# Patient Record
Sex: Male | Born: 1950 | Race: Black or African American | Hispanic: No | Marital: Married | Smoking: Current every day smoker
Health system: Southern US, Community
[De-identification: ages and names within clinical notes are randomized; demographics above are authoritative.]

## PROBLEM LIST (undated history)

## (undated) DIAGNOSIS — I10 Essential (primary) hypertension: Secondary | ICD-10-CM

## (undated) DIAGNOSIS — C61 Malignant neoplasm of prostate: Secondary | ICD-10-CM

## (undated) HISTORY — PX: PROSTATE BIOPSY: SHX241

## (undated) HISTORY — PX: MOUTH SURGERY: SHX715

## (undated) HISTORY — PX: TONSILLECTOMY: SUR1361

---

## 1998-10-11 ENCOUNTER — Ambulatory Visit (HOSPITAL_COMMUNITY): Admission: RE | Admit: 1998-10-11 | Discharge: 1998-10-11 | Payer: Self-pay | Admitting: Gastroenterology

## 2003-09-06 ENCOUNTER — Ambulatory Visit (HOSPITAL_COMMUNITY): Admission: RE | Admit: 2003-09-06 | Discharge: 2003-09-06 | Payer: Self-pay | Admitting: Gastroenterology

## 2009-09-29 ENCOUNTER — Emergency Department (HOSPITAL_COMMUNITY): Admission: EM | Admit: 2009-09-29 | Discharge: 2009-09-29 | Payer: Self-pay | Admitting: Emergency Medicine

## 2010-06-15 LAB — CULTURE, ROUTINE-ABSCESS

## 2010-08-15 NOTE — Op Note (Signed)
NAME:  Dylan Griffith, Dylan Griffith                          ACCOUNT NO.:  [id number]   MEDICAL RECORD NO.:  [id number]                   PATIENT TYPE:  AMB   LOCATION:  ENDO                                 FACILITY:  [facility]   PHYSICIAN:  John C. Madilyn Fireman, M.D.                 DATE OF BIRTH:  [birth year]   DATE OF PROCEDURE:  09/06/2003  DATE OF DISCHARGE:                                 OPERATIVE REPORT   PROCEDURE:  Colonoscopy.   INDICATIONS FOR PROCEDURE:  Heme-positive stools.   DESCRIPTION OF PROCEDURE:  The patient was placed in the left lateral  decubitus position and placed on the pulse monitor with continuous low-flow  oxygen delivered by nasal cannula.  He was sedated with 62.57 mcg IV  fentanyl and @@ mg IV Versed.  The Olympus video colonoscope was inserted  into the rectum and advanced to the cecum, confirmed by transillumination of  McBurney's point and visualization of the ileocecal valve and appendiceal  orifice.  Prep was good.  The cecum, ascending, transverse, descending, and  sigmoid colon all appeared normal with no masses, polyps, diverticula, or  other mucosal abnormalities.  The rectum likewise appeared normal and  retroflexed view of the anus revealed no obvious internal hemorrhoids.  The  scope was then withdrawn and the patient returned to the recovery room in  stable condition.  He tolerated the procedure well and there were no  immediate complications.   IMPRESSION:  Normal colonoscopy.   PLAN:  Will repeat hemoccults in four weeks.                                               John C. Madilyn Fireman, M.D.    JCH/MEDQ  D:  09/06/2003  T:  09/06/2003  Job:  [id number]   cc:   Jethro Bastos, M.D.  [address]  [place]  [place] [address]  Fax: [phone]

## 2013-09-26 ENCOUNTER — Ambulatory Visit
Admission: RE | Admit: 2013-09-26 | Discharge: 2013-09-26 | Disposition: A | Discharge status: Home / Self Care | Payer: Medicare PPO | Source: Ambulatory Visit | Attending: Family Medicine | Admitting: Family Medicine

## 2013-09-26 ENCOUNTER — Other Ambulatory Visit: Payer: Self-pay | Admitting: Family Medicine

## 2013-09-26 DIAGNOSIS — M25562 Pain in left knee: Secondary | ICD-10-CM

## 2013-09-26 DIAGNOSIS — M25462 Effusion, left knee: Secondary | ICD-10-CM

## 2014-01-02 ENCOUNTER — Other Ambulatory Visit: Payer: Self-pay | Admitting: Gastroenterology

## 2014-06-18 ENCOUNTER — Other Ambulatory Visit: Payer: Self-pay | Admitting: Family Medicine

## 2014-06-18 DIAGNOSIS — Z139 Encounter for screening, unspecified: Secondary | ICD-10-CM

## 2014-06-25 ENCOUNTER — Ambulatory Visit: Payer: Medicare PPO

## 2014-06-27 ENCOUNTER — Ambulatory Visit: Payer: Medicare PPO

## 2014-07-02 ENCOUNTER — Ambulatory Visit
Admission: RE | Admit: 2014-07-02 | Discharge: 2014-07-02 | Disposition: A | Discharge status: Home / Self Care | Payer: Medicare PPO | Source: Ambulatory Visit | Attending: Family Medicine | Admitting: Family Medicine

## 2014-07-02 DIAGNOSIS — Z139 Encounter for screening, unspecified: Secondary | ICD-10-CM

## 2014-12-18 DIAGNOSIS — F17200 Nicotine dependence, unspecified, uncomplicated: Secondary | ICD-10-CM | POA: Diagnosis not present

## 2014-12-18 DIAGNOSIS — C61 Malignant neoplasm of prostate: Secondary | ICD-10-CM | POA: Diagnosis not present

## 2014-12-18 DIAGNOSIS — I10 Essential (primary) hypertension: Secondary | ICD-10-CM | POA: Diagnosis not present

## 2014-12-18 DIAGNOSIS — Z23 Encounter for immunization: Secondary | ICD-10-CM | POA: Diagnosis not present

## 2014-12-26 DIAGNOSIS — C61 Malignant neoplasm of prostate: Secondary | ICD-10-CM | POA: Diagnosis not present

## 2015-03-19 ENCOUNTER — Telehealth: Payer: Self-pay | Admitting: Medical Oncology

## 2015-03-19 NOTE — Telephone Encounter (Signed)
Oncology Nurse Navigator Documentation  Oncology Nurse Navigator Flowsheets 03/19/2015  Referral date to RadOnc/MedOnc 03/15/2015  Navigator Encounter Type Introductory phone call  Interventions Coordination of Care  Coordination of Care MD Appointments  Support Groups/Services Friends and Family  Time Spent with Patient 15   I called pt to introduce myself as the Prostate Nurse Navigator and the Coordinator of the Prostate [place].  1. I confirmed with the patient he is aware of his referral to the clinic 12/27 arriving at 12:30. He states that he has to keep his grandson next week and will not be able to come on the 27 th. I will call him back with a new date and time.   2. I discussed the format of the clinic and the physicians he will be seeing that day.  3. I discussed where the clinic is located and how to contact me.  4. I confirmed his address and informed him I would be mailing a packet of information and forms to be completed. I asked him to bring them with him the day of his appointment.   He voiced understanding of the above. I asked him to call me if he has any questions or concerns regarding his appointments or the forms he needs to complete.

## 2015-03-19 NOTE — Telephone Encounter (Signed)
Oncology Nurse Navigator Documentation  Oncology Nurse Navigator Flowsheets 03/19/2015 03/19/2015  Referral date to RadOnc/MedOnc 03/15/2015 -  Navigator Encounter Type Introductory phone call Telephone- I called Dylan Griffith to let him know that I was able to reschedule his appointment for the Prostate [place] to 04/23/15 arriving at 12:15pm. He voiced understanding. I will include this in the packet with the medical forms. He voiced understanding.  Interventions Coordination of Care Coordination of Care  Coordination of Care MD Appointments MD Appointments  Support Groups/Services Friends and Family -  Time Spent with Patient 15 15

## 2015-03-26 ENCOUNTER — Ambulatory Visit: Payer: Medicare PPO | Admitting: Radiation Oncology

## 2015-04-17 ENCOUNTER — Encounter: Payer: Self-pay | Admitting: Adult Health

## 2015-04-17 DIAGNOSIS — C61 Malignant neoplasm of prostate: Secondary | ICD-10-CM | POA: Insufficient documentation

## 2015-04-22 ENCOUNTER — Encounter: Payer: Self-pay | Admitting: Radiation Oncology

## 2015-04-22 ENCOUNTER — Telehealth: Payer: Self-pay | Admitting: Medical Oncology

## 2015-04-22 NOTE — Progress Notes (Signed)
GU Location of Tumor / Histology: prostatic adenocarcinoma  If Prostate Cancer, Gleason Score is (3 + 3) and PSA is (13.24) on 12/26/2014  Dylan Griffith was diagnosed with prostate cancer in July 2011. He chose to be on active surveillance bu, had refused repeat biopsy. Then, January 2015 his PSA continued to increase so he had a second biopsy. He decided not to have definitive treatment but, his PSA continues to rise.  Biopsies of prostate (if applicable) revealed:    Past/Anticipated interventions by urology, if any: biopsy, diagnosis, surveillance, repeat biopsy, referral to [facility]  Past/Anticipated interventions by medical oncology, if any: no  Weight changes, if any: no  Bowel/Bladder complaints, if any: nocturia   Nausea/Vomiting, if any: no  Pain issues, if any:  no  SAFETY ISSUES:  Prior radiation? no  Pacemaker/ICD? no  Possible current pregnancy? no  Is the patient on methotrexate? no  Current Complaints / other details:  65 year old male. Married.

## 2015-04-22 NOTE — Telephone Encounter (Signed)
Oncology Nurse Navigator Documentation  Oncology Nurse Navigator Flowsheets 03/19/2015 03/19/2015 04/22/2015  Navigator Location - - [facility]-Med Onc  Navigator Encounter Type Introductory phone call Telephone Telephone  Telephone - - Outgoing Call;Appt Confirmation/Clarification- Dylan Griffith to arrive at 12:30pm. I asked him to bring his completed medical forms and to have lunch before his arrival due to the length of the clinic.We reviewed the valet parking and how to register at time of arrival. All questions were addressed and he voiced understanding of the above.  Confirmed Diagnosis Date - - 10/07/2009  Barriers/Navigation Needs - - No barriers at this time  Interventions Coordination of Care Coordination of Care None required  Coordination of Care MD Appointments MD Appointments -  Support Groups/Services Friends and Family - -  Acuity - - Level 1  Acuity Level 1 - - Initial guidance, education and coordination as needed  Time Spent with Patient 15 15 15

## 2015-04-23 ENCOUNTER — Ambulatory Visit (HOSPITAL_BASED_OUTPATIENT_CLINIC_OR_DEPARTMENT_OTHER): Payer: Medicare Other | Admitting: Oncology

## 2015-04-23 ENCOUNTER — Ambulatory Visit
Admission: RE | Admit: 2015-04-23 | Discharge: 2015-04-23 | Disposition: A | Discharge status: Home / Self Care | Payer: Medicare Other | Source: Ambulatory Visit | Attending: Radiation Oncology | Admitting: Radiation Oncology

## 2015-04-23 ENCOUNTER — Encounter: Payer: Self-pay | Admitting: Medical Oncology

## 2015-04-23 ENCOUNTER — Encounter: Payer: Self-pay | Admitting: Radiation Oncology

## 2015-04-23 ENCOUNTER — Encounter: Payer: Self-pay | Admitting: Adult Health

## 2015-04-23 VITALS — BP 121/70 | HR 78 | Temp 98.9°F | Resp 16 | Ht 70.0 in | Wt 164.0 lb

## 2015-04-23 DIAGNOSIS — C61 Malignant neoplasm of prostate: Secondary | ICD-10-CM

## 2015-04-23 DIAGNOSIS — Z720 Tobacco use: Secondary | ICD-10-CM | POA: Diagnosis not present

## 2015-04-23 HISTORY — DX: Malignant neoplasm of prostate: C61

## 2015-04-23 HISTORY — DX: Essential (primary) hypertension: I10

## 2015-04-23 NOTE — Progress Notes (Signed)
Radiation Oncology         [phone]) [phone] ________________________________  Initial outpatient Consultation  Name: Dylan Griffith MRN: [id number]  Date: 04/23/2015  DOB: [birth year]     [id number], MD  Raynelle Bring, MD   REFERRING PHYSICIAN: Raynelle Bring, MD   DIAGNOSIS: 65 y.o. gentleman with stage T1c adenocarcinoma of the prostate with a Gleason's score of 3+4 and a PSA of 13.24    ICD-9-CM ICD-10-CM   1. Prostate cancer ([place]) [place] ILLNESS::Dylan Griffith is a 65 y.o. gentleman who was diagnosed with prostate cancer in July 2011. During this time he had a PSA of 5.56.  DRE at that time had no nodules.  TRUS with biopsy by Dr. Janice Norrie showed that all 6 left-sided cores out of 12 prostate cores were positive with 5 showing Gleason 3+3 and one with Gleason of 3+4. He was followed by Dr.Nesi. He chose to be on active surveillance and had refused repeat biopsy. Then in January 2015 his PSA continued to increase so he had a second biopsy in April 2015. Pathology showed disease in the same locations but with a Gleason max of 6.  He decided not to have definitive treatment but his PSA continues to rise, his most recent PSA level is at 13.24.         The patient has kindly been referred today for discussion of potential radiation treatment options.   PREVIOUS RADIATION THERAPY: No  PAST MEDICAL HISTORY:  has a past medical history of Prostate cancer ([place]) and Hypertension.    PAST SURGICAL HISTORY: Past Surgical History  Procedure Laterality Date  . Prostate biopsy    . Prostate biopsy    . Tonsillectomy    . Mouth surgery      "surgical tooth extraction"    FAMILY HISTORY: family history is negative for Cancer.  SOCIAL HISTORY:  reports that he has been smoking Cigarettes.  He has a 7.5 pack-year smoking history. He has never used smokeless tobacco. He reports that he drinks alcohol. He reports that he uses illicit drugs.  ALLERGIES: Review  of patient's allergies indicates no known allergies.  MEDICATIONS:  Current Outpatient Prescriptions  Medication Sig Dispense Refill  . amLODipine-benazepril (LOTREL) 10-20 MG capsule Take 1 capsule by mouth daily.    . Ascorbic Acid (VITAMIN C) 1000 MG tablet Take 1,000 mg by mouth daily.     No current facility-administered medications for this encounter.    REVIEW OF SYSTEMS:  A 15 point review of systems is documented in the electronic medical record. This was obtained by the nursing staff. However, I reviewed this with the patient to discuss relevant findings and make appropriate changes.  Pertinent items are noted in HPI..  The patient completed an IPSS and IIEF questionnaire.  His IPSS score was 2 indicating mild urinary outflow obstructive symptoms.  He indicated that his erectile function is able to complete sexual activity more than half of the time.   PHYSICAL EXAM: This patient is in no acute distress.  He is alert and oriented.   height is 5\' 10"  (1.778 m) and weight is 164 lb (74.39 kg). His oral temperature is 98.9 F (37.2 C). His blood pressure is 121/70 and his pulse is 78. His respiration is 16 and oxygen saturation is 100%.  He exhibits no respiratory distress or labored breathing.  He appears neurologically intact.  His mood is pleasant.  His affect is appropriate.  Please  note the digital rectal exam findings described above.  KPS = 90  100 - Normal; no complaints; no evidence of disease. 90   - Able to carry on normal activity; minor signs or symptoms of disease. 80   - Normal activity with effort; some signs or symptoms of disease. 84   - Cares for self; unable to carry on normal activity or to do active work. 60   - Requires occasional assistance, but is able to care for most of his personal needs. 50   - Requires considerable assistance and frequent medical care. 75   - Disabled; requires special care and assistance. 3   - Severely disabled; hospital admission is  indicated although death not imminent. 45   - Very sick; hospital admission necessary; active supportive treatment necessary. 10   - Moribund; fatal processes progressing rapidly. 0     - Dead  Karnofsky DA, Abelmann WH, Craver LS and Burchenal JH [phone]) The use of the nitrogen mustards in the palliative treatment of carcinoma: with particular reference to bronchogenic carcinoma Cancer 1 634-56   LABORATORY DATA:  No results found for: WBC, HGB, HCT, MCV, PLT No results found for: NA, K, CL, CO2 No results found for: ALT, AST, GGT, ALKPHOS, BILITOT   RADIOGRAPHY: No results found.    IMPRESSION: The patient is a 65 y.o. gentleman with stage T1c adenocarcinoma of the prostate previously notedd with a Gleason's score of 3+4 and a current PSA of 13.24. The patient maybe a good candidate for radiation therapy but additional workup may help guide treatment options based on more up-to-date information.   PLAN: Today, I talked to the patient and family about the findings and work-up thus far.  We discussed the natural history of intermediate prostate cancer and general treatment, highlighting the role of radiotherapy in the management.  We discussed the available radiation techniques, and focused on the details of logistics and delivery.    Patient may benefit from additional updated diagnostic information. We would recommend MRI to assess diease status followed by repeat prostate biopsy.    I spent time face to face with the patient and more than 50% of that time was spent in counseling and/or coordination of care.   ------------------------------------------------  Sheral Apley. Tammi Klippel, M.D.  This document serves as a record of services personally performed by Tyler Pita, MD. It was created on his behalf by Derek Mound, a trained medical scribe. The creation of this record is based on the scribe's personal observations and the provider's statements to them. This document has been checked  and approved by the attending provider.

## 2015-04-23 NOTE — Progress Notes (Signed)
Please see consult note.  

## 2015-04-23 NOTE — Progress Notes (Signed)
   Survivorship Program  Mr. Schwabe is a very pleasant 65 y.o. gentleman from [place], [place] with a diagnosis of prostate adenocarcinoma. He presents today to the [facility]) for treatment consideration and recommendations from the urologist/surgeon, radiation oncologist, and medical oncologist.    I briefly met with Mr. Herrington during his [place] visit today. We discussed the purpose of the Survivorship Clinic, which will include monitoring for recurrence, coordinating completion of age and gender-appropriate cancer screenings, promotion of overall wellness, as well as managing potential late/long-term side effects of anti-cancer treatments.     As of today, the intent of treatment for Mr. Depaulo is cure/local control, therefore he will be eligible for the Survivorship Clinic upon his completion of treatment.  His survivorship care plan (SCP) will be reviewed with him during an in-person visit with myself once he has completed treatment.    Mr. Ashkar was encouraged to ask questions and all questions were answered to his satisfaction.  He was given my business card and encouraged to contact me with any concerns regarding survivorship.  I look forward to participating in his care.    Mike Craze, NP [place] [phone]

## 2015-04-23 NOTE — Progress Notes (Signed)
                               Care Plan Summary  Name: Dylan Griffith  DOB: [birth year]   Your Medical Team:   Urologist -  Dr. Raynelle Bring, [facility]  Radiation Oncologist - Dr.Matthew Micki [facility]   Medical Oncologist - Dr. Zola Button, [place]  Recommendations: 1) PSA 2) MRI 3) Pending results of MRI surgery or radiation  * These recommendations are based on information available as of today's consult.      Recommendations may change depending on the results of further tests or exams.  Next Steps: 1) Dr. Lynne Logan office will schedule lab draw and MRI  2) Dr. Alinda Money will contact you with results and discuss tx   When appointments need to be scheduled, you will be contacted by [facility] and/or [facility].  Questions?  Please do not hesitate to call Cira Rue, RN, BSN, CRNI at [phone] any questions or concerns.  Shirlean Mylar is your Oncology Nurse Navigator and is available to assist you while you're receiving your medical care at [facility].

## 2015-04-23 NOTE — Consult Note (Signed)
Reason for Referral: Prostate cancer.   HPI: 65 year old gentleman currently of [place] where he lived the majority of his life. He initially presented with an elevated PSA back in July 2011 at that time he had a PSA 5.5 and evaluated by Dr. Janice Norrie and underwent a biopsy and showed all cores of the left side showed involvement with Gleason score 6 prostate cancer. He had 1 core of Gleason score 3+4 = 7. He remained on surveillance and repeat biopsy in April 2015 showed similar findings with Gleason score 6 predominantly on the left side. He elected to continue with active surveillance and most recently in September 2016 had a PSA repeated and it was elevated at that time up to 13.2. A repeat biopsy at that time showed a Gleason score 3+3 = 6 however showed increased high volume of disease with some of these cores up to 50 and 60% of malignancy. Despite that, he continues to have no symptoms at this time. He denied any hematuria or dysuria. He denied any frequency urgency or hesitancy.  He does not report any headaches, blurry vision, syncope or seizure. Does not report any fevers or chills or sweats. Does not report any cough, wheezing or hemoptysis. Does not report any nausea, vomiting or abdominal pain. Does not report any skeletal complaints of arthralgias or myalgias. Does not report any bleeding or clotting tendencies. Remaining review of systems unremarkable.   Past Medical History  Diagnosis Date  . Prostate cancer ([place])   . Hypertension   :  Past Surgical History  Procedure Laterality Date  . Prostate biopsy    . Prostate biopsy    . Tonsillectomy    . Mouth surgery      "surgical tooth extraction"  :   Current outpatient prescriptions:  .  amLODipine-benazepril (LOTREL) 10-20 MG capsule, Take 1 capsule by mouth daily., Disp: , Rfl:  .  Ascorbic Acid (VITAMIN C) 1000 MG tablet, Take 1,000 mg by mouth daily., Disp: , Rfl: :  No Known Allergies:  Family History  Problem Relation  Age of Onset  . Cancer Neg Hx   :  Social History   Social History  . Marital Status: Married    Spouse Name: N/A  . Number of Children: N/A  . Years of Education: N/A   Occupational History  . Not on file.   Social History Main Topics  . Smoking status: Current Every Day Smoker -- 0.25 packs/day for 30 years    Types: Cigarettes  . Smokeless tobacco: Never Used  . Alcohol Use: Yes     Comment: a few per week.  . Drug Use: Yes     Comment: 2 drinks per week  . Sexual Activity: Yes   Other Topics Concern  . Not on file   Social History Narrative  :  Pertinent items are noted in HPI.  Exam:  General appearance: alert and cooperative without distress. Head: Normocephalic, without obvious abnormality Nose: Nares normal. Septum midline. Mucosa normal. No drainage or sinus tenderness. No oral thrush. Neck: no adenopathy Back: negative Resp: clear to auscultation bilaterally Chest wall: no tenderness Cardio: regular rate and rhythm, S1, S2 normal, no murmur, click, rub or gallop GI: soft, non-tender; bowel sounds normal; no masses,  no organomegaly Extremities: extremities normal, atraumatic, no cyanosis or edema Pulses: 2+ and symmetric Skin: Skin color, texture, turgor normal. No rashes or lesions     Assessment and Plan:   65 year old gentleman with prostate cancer dating back to 2011.  Gleason score of 3+3 = 6. He had been on active surveillance at this time and his most recent PSA was increased up to 13.24 . His last biopsy showed high volume disease predominantly on the left side with a Gleason score of 3+3 = 6 in 6 cores.  His case was discussed today in the multi-disciplinary prostate cancer clinic and his pathology was discussed with the reviewing pathologist. It appears that his disease have slightly increased in volume and warrants treatment at this time. Options of treatment were reviewed which include surgical resection with a radical prostatectomy versus  radiation therapy. To better determine the best course of action and MRI and a repeat PSA might be reasonable for further risk stratification. I see no role for systemic chemotherapy at this time but certainly he is at high risk now of cancer progression and possible metastasis if no definitive therapy is pursued.  He is open to undergo therapy at this time and he'll make up his mind the near future once his imaging studies and repeat PSA are done.

## 2015-04-23 NOTE — Consult Note (Signed)
Chief Complaint  Prostate Cancer   Reason For Visit  Reason for consult: To discuss options for treatment/management of prostate cancer. Physician requesting consult: Dr. Lowella Bandy PCP: Dr. Burnadette Pop Location of consult: [facility]   History of Present Illness  [name] is a 65 year old gentleman with a past medical history significant for hypertension and tobacco use (1/6 PPD for over 25 years).  He was initially diagnosed with prostate cancer in July 2011.  He was noted to have an elevated PSA of 5.56 and for this reason underwent a prostate biopsy on 10/24/09 that confirmed 6 out of 12 biopsy cores positive (all on the left) with one core of Gleason 3+4=7 (and perineural invasion) and Gleason 3+3=6 in the remaining positive cores.  He was counseled thoroughly by Dr. Janice Norrie and recommended to undergo treatment of curative intent.  However, he adamantly did not wish curative treatment and instead elected active surveillance for management against Dr. Sammie Bench recommendations. He also refused further biopsies even on surveillance. He then became non-compliant with follow up after his visit in August 2012, he did not follow up again until January 2015 when he was referred back to Dr. Janice Norrie at the urging of his PCP, Dr. Mechele Collin for a rising PSA of 8.2 in December 2014.  His DRE remained normal but his PSA further increased to 11.20 in March 2015 prompting him to agree to another biopsy at that time.  His biopsy on 07/27/13 again demonstrated 6 out of 12 biopsy cores to be positive (again all on the left) with all Gleason 3+3=6 disease but slightly higher percentage involvement and multiple areas with perineural invasion identified. Based on these results, he still adamantly refused any curative therapy and again did not follow up until September 2016 when his PSA had further increased to 13.24.    He has no family history of prostate cancer.     Initial diagnosis: July 2011 TNM stage: cT1c Nx Mx PSA at diagnosis: 5.56 Gleason score: 3+4=7 Biopsy (10/24/09): 6/12 cores  - L lateral apex (10%, 3+3=6), L apex (40%, 3+4=7, PNI), L lateral mid (30%, 3+3=6), L mid (20%, 3+3=6), L lateral base (50%, 3+3=6), L base (40%, 3+3=6) Prostate volume: 34.4 cc PSAD: 0.16  Surveillance Dec 2014: PSA 8.29 May 2013: PSA 11.29 Jun 2013 (07/27/13): 12 core biopsy - 6/12 cores positive -- L lateral apex (50%, 3+3=6), L apex (20%, 3+3=6), L lateral mid (50%, 3+3=6, PNI), L mid (60%, 3+3=6, PNI), L lateral base (40%, 3+3=6, PNI), L base (50%, 3+3=6, PNI), Vol 48.23 cc Sep 2016: PSA 13.24  Nomogram OC disease: 28% EPE: 69% SVI: 7% LNI: 5% PFS (surgery): 75% at 5 years, 61% at 10 years  Urinary function: He denies significant difficulties urinating.  IPSS is 4. Erectile function: He has preserved erectile function.  SHIM score is 23.   Past Medical History  1. History of hypertension (Z86.79)  Surgical History  1. History of Oral Surgery  2. History of Tonsillectomy  Current Meds  1. Amlodipine Besy-Benazepril HCl - 10-20 MG Oral Capsule;  Therapy: [id number] to Recorded  2. Aspirin 81 MG TABS;  Therapy: (Recorded:17Jun2011) to Recorded  Allergies  1. No Known Drug Allergies  Family History  1. Denied: Family history of prostate cancer  2. Family history of Mother Deceased At Age ___  3. No pertinent family history : Mother  Social History   Alcohol Use (History)   Current every day smoker (F17.200)  Marital History - Currently Married  Review of Systems AU Complete-Male: Genitourinary, constitutional, skin, eye, otolaryngeal, hematologic/lymphatic, cardiovascular, pulmonary, endocrine, musculoskeletal, gastrointestinal, neurological and psychiatric system(s) were reviewed and pertinent findings if present are noted and are otherwise negative.    Physical Exam Constitutional: Well nourished and well developed . No acute  distress.  Rectal: Rectal exam demonstrates normal sphincter tone, no tenderness and no masses. Prostate size is estimated to be 45 g. His exam does demonstrate induration along the left lateral aspect of the prostate from the base of the apex. There is no clear extraprostatic extension although his induration extends toward the lateral aspect of the left capsule. The prostate is not tender. The left seminal vesicle is nonpalpable. The right seminal vesicle is nonpalpable. The perineum is normal on inspection.  Neuro/Psych:. Mood and affect are appropriate.    Results/Data  I have extensively reviewed his prior medical information including his office notes from Dr. Janice Norrie, pathology reports and slides from 2011 and 2015, as well as his PSA results.  Findings are as dictated above.     Assessment  1. Prostate cancer (C61)  Discussion/Summary  1.  Prostate cancer: I had a detailed discussion with Mr. Fago today regarding his prostate cancer diagnosis and recommendations for management/treatment.  We discussed his noncompliance and his prior decisions to avoid therapy of curative intent despite Dr. Sammie Bench recommendations.  He stated that he was not having symptoms related to his prostate cancer and therefore it was difficult for him to see the need to proceed with treatment.  I therefore had a long discussion regarding the natural history of prostate cancer and explained that the development of symptoms typically would occur at a stage when his prostate cancer would likely be incurable.  We had a discussion regarding his prostate cancer based on information it may be outdated including his last biopsy performed in April 2015 as well as his last PSA which was rising but was drawn in September 2016.  He understands that he had at least intermediate risk disease at that time and that the standard recommendation was to proceed with curative treatment and that failure to do so did risk the development of  advanced, incurable disease.   The patient was counseled about the natural history of prostate cancer and the standard treatment options that are available for prostate cancer. It was explained to him how his age and life expectancy, clinical stage, Gleason score, and PSA affect his prognosis, the decision to proceed with additional staging studies, as well as how that information influences recommended treatment strategies. We discussed the roles for active surveillance, radiation therapy, surgical therapy, androgen deprivation, as well as ablative therapy options for the treatment of prostate cancer as appropriate to his individual cancer situation. We discussed the risks and benefits of these options with regard to their impact on cancer control and also in terms of potential adverse events, complications, and impact on quiality of life particularly related to urinary, bowel, and sexual function. The patient was encouraged to ask questions throughout the discussion today and all questions were answered to his stated satisfaction. In addition, the patient was provided with and/or directed to appropriate resources and literature for further education about prostate cancer and treatment options.   Mr. Creasman expressed his understanding about his cancer situation and clearly understood the failure to proceed with treatment of curative intent may risk and developing incurable disease.  We did briefly discuss the natural history of prostate cancer and the options  for management of his disease should he forego curative therapy and proceed with ongoing surveillance management.  He understands and my recommendation is for him to proceed with therapy of curative intent.  At this time, he is agreeable to proceed with further evaluation including a repeat PSA.  We discussed his worrisome digital rectal exam which likely suggest progression and have agreed to proceed with an MRI to fully determine whether he might have  locally advanced disease.  He will then follow up with me for further discussion regarding options for management of his disease.  If his MRI suggests no extraprostatic extension, we will discuss proceeding with a repeat biopsy since his last biopsy was almost 2 years ago in order to determine appropriate staging and the best approach to either surgery or radiation treatment.  He is scheduled to see both Dr. Tammi Klippel and Dr. Alen Blew later today to further discuss his options and recommendations.  A total of 65 minutes were spent in the overall care of the patient today with 55 minutes in direct face to face consultation.   Cc: Dr. Tyler Pita Dr. Lowella Bandy Dr. Zola Button Dr. Burnadette Pop    Signatures Electronically signed by : Raynelle Bring, M.D.; Apr 23 2015  4:59PM EST

## 2015-04-23 NOTE — Progress Notes (Signed)
Retired. Married to [employer]. Two children, Beverely Low [facility]) and Deatrice Baldo Ash). Reports his last colonoscopy was done 01/02/14. Denies performing regular self testicular exams.

## 2015-04-24 ENCOUNTER — Other Ambulatory Visit (HOSPITAL_COMMUNITY): Payer: Self-pay | Admitting: Urology

## 2015-04-24 DIAGNOSIS — C61 Malignant neoplasm of prostate: Secondary | ICD-10-CM

## 2015-05-07 ENCOUNTER — Telehealth: Payer: Self-pay | Admitting: Medical Oncology

## 2015-05-07 NOTE — Telephone Encounter (Signed)
Oncology Nurse Navigator Documentation  Oncology Nurse Navigator Flowsheets 04/22/2015 04/23/2015 05/07/2015  Navigator Location [facility] - -  Navigator Encounter Type Telephone Clinic/[facility] Telephone;[facility] Follow-up  Telephone Outgoing Call;Appt Confirmation/Clarification - -  Confirmed Diagnosis Date 10/07/2009 - -  Barriers/Navigation Needs No barriers at this time Education Education- Mr. Bein is scheduled for his MRI 05/14/15. He had a lot of questions regarding robotic prostatectomy vs radiation. He has been on line reading and he states that he has not found much encouraging new from men that have had the surgery. He asked if he could speak with men that have been thru each of these treatments. I encouraged him to come to the Prostate Support group 2/20 where he can meet and discuss with members of the group. Dr. Tammi Klippel will also be there to discuss radiation. He states he will make an effort to come. If he is not able to attend, I have former patients that are willing to be a mentor that will reach out to him. I will continue to follow and I asked him to call me with any questions or concerns. He voiced understanding.  Education - Landscape architect Treatment Options  Interventions None required Education Method Education Method  Coordination of Care - - -  Education Method - Teach-back;Verbal Verbal  Support Groups/Services - Friends and Family Prostate Support Group;Friends and Family  Acuity Level 1 Level 3 Level 2  Acuity Level 1 Initial guidance, education and coordination as needed - -  Acuity Level 2 - - Ongoing guidance and education throughout treatment as needed  Acuity Level 3 - Ongoing guidance and education provided throughout treatment -  Time Spent with Patient 15 > 120 15

## 2015-05-14 ENCOUNTER — Ambulatory Visit (HOSPITAL_COMMUNITY): Admission: RE | Admit: 2015-05-14 | Payer: Medicare Other | Source: Ambulatory Visit

## 2015-05-24 ENCOUNTER — Other Ambulatory Visit (HOSPITAL_COMMUNITY): Payer: Medicare Other

## 2015-05-27 ENCOUNTER — Ambulatory Visit (HOSPITAL_COMMUNITY)
Admission: RE | Admit: 2015-05-27 | Discharge: 2015-05-27 | Disposition: A | Discharge status: Home / Self Care | Payer: Medicare Other | Source: Ambulatory Visit | Attending: Urology | Admitting: Urology

## 2015-05-27 DIAGNOSIS — C61 Malignant neoplasm of prostate: Secondary | ICD-10-CM

## 2015-05-27 LAB — POCT I-STAT CREATININE: Creatinine, Ser: 0.8 mg/dL (ref 0.61–1.24)

## 2015-05-27 MED ORDER — GADOBENATE DIMEGLUMINE 529 MG/ML IV SOLN
20.0000 mL | Freq: Once | INTRAVENOUS | Status: AC | PRN
  Administered 2015-05-27: 16 mL via INTRAVENOUS

## 2015-06-10 ENCOUNTER — Encounter: Payer: Self-pay | Admitting: Radiation Oncology

## 2015-06-10 ENCOUNTER — Ambulatory Visit
Admission: RE | Admit: 2015-06-10 | Discharge: 2015-06-10 | Disposition: A | Discharge status: Home / Self Care | Payer: Medicare Other | Source: Ambulatory Visit | Attending: Radiation Oncology | Admitting: Radiation Oncology

## 2015-06-10 VITALS — BP 139/79 | HR 75 | Resp 16 | Wt 166.9 lb

## 2015-06-10 DIAGNOSIS — Z87891 Personal history of nicotine dependence: Secondary | ICD-10-CM | POA: Diagnosis not present

## 2015-06-10 DIAGNOSIS — F1010 Alcohol abuse, uncomplicated: Secondary | ICD-10-CM | POA: Diagnosis not present

## 2015-06-10 DIAGNOSIS — Z510 Encounter for antineoplastic radiation therapy: Secondary | ICD-10-CM | POA: Insufficient documentation

## 2015-06-10 DIAGNOSIS — I10 Essential (primary) hypertension: Secondary | ICD-10-CM | POA: Insufficient documentation

## 2015-06-10 DIAGNOSIS — C61 Malignant neoplasm of prostate: Secondary | ICD-10-CM | POA: Insufficient documentation

## 2015-06-10 NOTE — Progress Notes (Signed)
See progress note under physician encounter. 

## 2015-06-10 NOTE — Progress Notes (Signed)
GU Location of Tumor / Histology: prostatic adenocarcinoma  If Prostate Cancer, Gleason Score is (3 + 3) and PSA is (13.24) on 12/26/2014  Dylan Griffith was diagnosed with prostate cancer in July 2011. He chose to be on active surveillance bu, had refused repeat biopsy. Then, January 2015 his PSA continued to increase so he had a second biopsy. He decided not to have definitive treatment but, his PSA continues to rise.  Biopsies of prostate (if applicable) revealed:    Past/Anticipated interventions by urology, if any: biopsy, diagnosis, surveillance, repeat biopsy, referral to [facility]. Denies having had an androgen deprivation injection.  Past/Anticipated interventions by medical oncology, if any: no  Weight changes, if any: no  Bowel/Bladder complaints, if any: nocturia x1. Denies dysuria or hematuria. Denies incontinence or leakage. IPSS 7. SHIM 22.   Nausea/Vomiting, if any: no  Pain issues, if any: no  SAFETY ISSUES:  Prior radiation? no  Pacemaker/ICD? no  Possible current pregnancy? no  Is Dylan patient on methotrexate? no  Current Complaints / other details: 65 year old male. Married to Dylan Griffith with two children. Retired. Reports his last colonoscopy was done 01/02/14. Denies performing routine testicular exams.  MR prostate done 05/27/15. Impression as follows: 11 x 6 mm lesion along Dylan left posterolateral apex of Dylan peripheral gland, worrisome for possible high-grade macroscopic prostate cancer.  Two additional foci of suspected low grade macroscopic prostate cancer within Dylan left mid gland and mid gland apex.  No findings specific for high-grade macroscopic prostate cancer in Dylan right peripheral gland.  No evidence of extracapsular extension, seminal vesicle invasion, or metastatic disease.

## 2015-06-10 NOTE — Progress Notes (Addendum)
Radiation Oncology         [phone]) [phone] ________________________________  Name: Dylan Griffith MRN: [id number]  Date: 06/10/2015  DOB: September 15, 1950    Follow-Up Visit Note  CC: Lujean Amel, MD  Lujean Amel, MD  Diagnosis:   65 y.o. gentleman with stage T1c adenocarcinoma of the prostate with a Gleason's score of 3+4 and a PSA of 13.24    ICD-9-CM ICD-10-CM   1. Prostate cancer ([place]) [place]     Narrative:  The patient returns today for follow-up.  He returns for further discussion of treatment options for prostate cancer. He originally presented to our clinic at the multidisciplinary prostate tumor board on 04/23/2015 , at which time an MRI was recommended as the patient had not had a formal biopsy since 2015. It is notable dimension of the patient did have a Gleason score 3+4 in one of his biopsies dating back to 2011, however a repeat in 2000 T only revealed a 3+3 in all of the affected specimens.  MRI of the prostate on 05/27/15 revealed 11 x 6 mm lesion along the left posterolateral apex of the peripheral gland, worrisome for possible high-grade macroscopic prostate cancer. No evidence for extracapsular extension, seminal vesicle invasion, or metastatic disease was present.   ROS: On review of systems, the patient reports that he is doing pretty well overall. He denies any significant urinary symptoms at this time and states that he is not experiencing any dysuria hematuria, bowel dysfunction, or erectile dysfunction. He is not experiencing any fevers or chills, chest pain or shortness of breath, unintended weight changes , nausea, vomiting, abdominal pain. A complete review of systems is obtained and is otherwise negative.                                ALLERGIES:  has No Known Allergies.  Meds: Current Outpatient Prescriptions  Medication Sig Dispense Refill  . amLODipine-benazepril (LOTREL) 10-20 MG capsule Take 1 capsule by mouth daily.    . Ascorbic Acid (VITAMIN C) 1000 MG  tablet Take 1,000 mg by mouth daily.    Marland Kitchen VITAMIN D, CHOLECALCIFEROL, PO Take by mouth.    . diazepam (VALIUM) 10 MG tablet Reported on 06/10/2015  0   No current facility-administered medications for this encounter.    Physical Findings:  weight is 166 lb 14.4 oz (75.705 kg). His blood pressure is 139/79 and his pulse is 75. His respiration is 16 and oxygen saturation is 100%.   Pain scale 0/10 In general this is a well appearing  African-American in no acute distress. He's alert and oriented x4 and appropriate throughout the examination. Cardiopulmonary assessment is negative for acute distress and  he exhibits normal effort.    Lab Findings: No results found for: WBC, HGB, HCT, PLT  Lab Results  Component Value Date   CREATININE 0.80 05/27/2015    Radiographic Findings: Mr Prostate W / Wo Cm  05/27/2015  CLINICAL DATA:  Biopsy-proven prostate cancer, Gleason score 6 EXAM: MR PROSTATE WITHOUT AND WITH CONTRAST TECHNIQUE: Multiplanar multisequence MRI images were obtained of the pelvis centered about the prostate. Pre and post contrast images were obtained. CONTRAST:  53mL MULTIHANCE GADOBENATE DIMEGLUMINE 529 MG/ML IV SOLN COMPARISON:  None. FINDINGS: Prostate: 11 x 6 mm irregular focus of low T2 signal along the left posterolateral apex (series 5/image 19), with associated restricted diffusion/low ADC medially (series 100/image 9), worrisome for possible high-grade macroscopic prostate cancer.  Additional 7 x 5 mm focus of low T2 signal in the left posterolateral mid gland (series 5/image 16) and additional 5 mm focus of vague low T2 signal in the left posterolateral mid gland to base (series 5/image 13), without associated restricted diffusion, likely corresponding to known low-grade macroscopic prostate cancer. No findings specific for high-grade macroscopic prostate cancer in the right peripheral gland. Scattered mild central gland nodularity without suspicious nodules on T2. Transcapsular  spread:  Absent. Seminal vesicle involvement: Absent. Neurovascular bundle involvement: Absent. Pelvic adenopathy: Absent. Bone metastasis: Absent. Other findings: None. IMPRESSION: 11 x 6 mm lesion along the left posterolateral apex of the peripheral gland, worrisome for possible high-grade macroscopic prostate cancer. Two additional foci of suspected low grade macroscopic prostate cancer within the left mid gland and mid gland apex. No findings specific for high-grade macroscopic prostate cancer in the right peripheral gland. No evidence of extracapsular extension, seminal vesicle invasion, or metastatic disease. Electronically Signed   By: Julian Hy M.D.   On: 05/27/2015 11:54    Impression:  The patient is a 65 y.o. gentleman with stage T1c adenocarcinoma of the prostate previously noted with a Gleason's score of 3+4 and a current PSA of 13.24. Recent MRI seems to confirm at least intermediate disease in the left apex. The patient would be a good candidate for external beam radiation or prostate seed implant +/- ADT.   Plan:  Today Dr. Tammi Klippel reviewed the findings and workup thus far.  We discussed the natural history of prostate cancer.  We reviewed the the implications of T-stage, Gleason's Score, and PSA on decision-making and outcomes in prostate cancer.  We discussed radiation treatment in the management of prostate cancer with regard to the logistics and delivery of external beam radiation treatment as well as the logistics and delivery of prostate brachytherapy. The patient expressed interest in external beam radiotherapy.  The patient would like to proceed with prostate IMRT.  I will share my findings with Dr. Alinda Money and move forward with scheduling placement of three gold fiducial markers into the prostate to proceed with IMRT in the near future as well as ADT.  We plan simulation for 06/24/2015 , the patient understands that his coworkers need to be placed at least 48 hours prior to  this on     I enjoyed meeting with him today, and will look forward to participating in the care of this very nice gentleman.  The above documentation reflects my direct findings during this shared patient visit. Please see the separate note by Dr. Tammi Klippel on this date for the remainder of the patient's plan of care.  Carola Rhine, PAC   This document serves as a record of services personally performed by Shona Simpson, PAC and Tyler Pita, MD. It was created on their behalf by Arlyce Harman, a trained medical scribe. The creation of this record is based on the scribe's personal observations and the provider's statements to them. This document has been checked and approved by the attending provider.

## 2015-06-10 NOTE — Progress Notes (Signed)
Radiation Oncology         [phone]) [phone] ________________________________  Initial outpatient Consultation  Name: Dylan Griffith MRN: [id number]  Date: 06/10/2015  DOB: 1951/03/16     [id number], MD  Lujean Amel, MD   REFERRING PHYSICIAN: Koirala, Dibas, MD   DIAGNOSIS: 65 y.o. gentleman with stage T1c adenocarcinoma of the prostate with a Gleason's score of 3+4 and a PSA of 13.24    ICD-9-CM ICD-10-CM   1. Prostate cancer ([place]) [place] ILLNESS::Dylan Griffith is a 65 y.o. gentleman who was diagnosed with prostate cancer in July 2011. During this time he had a PSA of 5.56.  DRE at that time had no nodules.  TRUS with biopsy by Dr. Janice Norrie showed that all 6 left-sided cores out of 12 prostate cores were positive with 5 showing Gleason 3+3 and one with Gleason of 3+4. He was followed by Dr.Nesi. He chose to be on active surveillance and had refused repeat biopsy. Then in January 2015 his PSA continued to increase so he had a second biopsy in April 2015. Pathology showed disease in the same locations but with a Gleason max of 6.  He decided not to have definitive treatment but his PSA continues to rise, his most recent PSA level is at 13.24.         The patient has kindly been referred today for discussion of potential radiation treatment options.  Since that time,   MRI 05/27/15 IMPRESSION: 11 x 6 mm lesion along the left posterolateral apex of the peripheral gland, worrisome for possible high-grade macroscopic prostate cancer.  Two additional foci of suspected low grade macroscopic prostate cancer within the left mid gland and mid gland apex.  No findings specific for high-grade macroscopic prostate cancer in the right peripheral gland.  No evidence of extracapsular extension, seminal vesicle invasion, or metastatic disease.        PREVIOUS RADIATION THERAPY: No  PAST MEDICAL HISTORY:  has a past medical history of Prostate cancer  ([place]) and Hypertension.    PAST SURGICAL HISTORY: Past Surgical History  Procedure Laterality Date  . Prostate biopsy    . Prostate biopsy    . Tonsillectomy    . Mouth surgery      "surgical tooth extraction"    FAMILY HISTORY: family history is negative for Cancer.  SOCIAL HISTORY:  reports that he has been smoking Cigarettes.  He has a 7.5 pack-year smoking history. He has never used smokeless tobacco. He reports that he drinks alcohol. He reports that he uses illicit drugs.  ALLERGIES: Review of patient's allergies indicates no known allergies.  MEDICATIONS:  Current Outpatient Prescriptions  Medication Sig Dispense Refill  . amLODipine-benazepril (LOTREL) 10-20 MG capsule Take 1 capsule by mouth daily.    . Ascorbic Acid (VITAMIN C) 1000 MG tablet Take 1,000 mg by mouth daily.     No current facility-administered medications for this encounter.    REVIEW OF SYSTEMS:  A 15 point review of systems is documented in the electronic medical record. This was obtained by the nursing staff. However, I reviewed this with the patient to discuss relevant findings and make appropriate changes.  Pertinent items are noted in HPI..  The patient completed an IPSS and IIEF questionnaire.  His IPSS score was 2 indicating mild urinary outflow obstructive symptoms.  He indicated that his erectile function is able to complete sexual activity more than half of the time.   PHYSICAL EXAM:  This patient is in no acute distress.  He is alert and oriented.   vitals were not taken for this visit. He exhibits no respiratory distress or labored breathing.  He appears neurologically intact.  His mood is pleasant.  His affect is appropriate.  Please note the digital rectal exam findings described above.  KPS = 90  100 - Normal; no complaints; no evidence of disease. 90   - Able to carry on normal activity; minor signs or symptoms of disease. 80   - Normal activity with effort; some signs or symptoms of  disease. 57   - Cares for self; unable to carry on normal activity or to do active work. 60   - Requires occasional assistance, but is able to care for most of his personal needs. 50   - Requires considerable assistance and frequent medical care. 40   - Disabled; requires special care and assistance. 77   - Severely disabled; hospital admission is indicated although death not imminent. 34   - Very sick; hospital admission necessary; active supportive treatment necessary. 10   - Moribund; fatal processes progressing rapidly. 0     - Dead  Karnofsky DA, Abelmann WH, Craver LS and Burchenal JH [phone]) The use of the nitrogen mustards in the palliative treatment of carcinoma: with particular reference to bronchogenic carcinoma Cancer 1 634-56   LABORATORY DATA:  No results found for: WBC, HGB, HCT, MCV, PLT No results found for: NA, K, CL, CO2 No results found for: ALT, AST, GGT, ALKPHOS, BILITOT   RADIOGRAPHY: Mr Prostate W / Wo Cm  05/27/2015  CLINICAL DATA:  Biopsy-proven prostate cancer, Gleason score 6 EXAM: MR PROSTATE WITHOUT AND WITH CONTRAST TECHNIQUE: Multiplanar multisequence MRI images were obtained of the pelvis centered about the prostate. Pre and post contrast images were obtained. CONTRAST:  32mL MULTIHANCE GADOBENATE DIMEGLUMINE 529 MG/ML IV SOLN COMPARISON:  None. FINDINGS: Prostate: 11 x 6 mm irregular focus of low T2 signal along the left posterolateral apex (series 5/image 19), with associated restricted diffusion/low ADC medially (series 100/image 9), worrisome for possible high-grade macroscopic prostate cancer. Additional 7 x 5 mm focus of low T2 signal in the left posterolateral mid gland (series 5/image 16) and additional 5 mm focus of vague low T2 signal in the left posterolateral mid gland to base (series 5/image 13), without associated restricted diffusion, likely corresponding to known low-grade macroscopic prostate cancer. No findings specific for high-grade macroscopic  prostate cancer in the right peripheral gland. Scattered mild central gland nodularity without suspicious nodules on T2. Transcapsular spread:  Absent. Seminal vesicle involvement: Absent. Neurovascular bundle involvement: Absent. Pelvic adenopathy: Absent. Bone metastasis: Absent. Other findings: None. IMPRESSION: 11 x 6 mm lesion along the left posterolateral apex of the peripheral gland, worrisome for possible high-grade macroscopic prostate cancer. Two additional foci of suspected low grade macroscopic prostate cancer within the left mid gland and mid gland apex. No findings specific for high-grade macroscopic prostate cancer in the right peripheral gland. No evidence of extracapsular extension, seminal vesicle invasion, or metastatic disease. Electronically Signed   By: Julian Hy M.D.   On: 05/27/2015 11:54      IMPRESSION: The patient is a 65 y.o. gentleman with stage T1c adenocarcinoma of the prostate previously notedd with a Gleason's score of 3+4 and a current PSA of 13.24. The patient maybe a good candidate for radiation therapy but additional workup may help guide treatment options based on more up-to-date information.   PLAN: Today, I talked to  the patient and family about the findings and work-up thus far.  We discussed the natural history of intermediate prostate cancer and general treatment, highlighting the role of radiotherapy in the management.  We discussed the available radiation techniques, and focused on the details of logistics and delivery.    Patient may benefit from additional updated diagnostic information. We would recommend MRI to assess diease status followed by repeat prostate biopsy.    I spent time face to face with the patient and more than 50% of that time was spent in counseling and/or coordination of care.   ------------------------------------------------  Sheral Apley. Tammi Klippel, M.D.  This document serves as a record of services personally performed by  Tyler Pita, MD. It was created on his behalf by Derek Mound, a trained medical scribe. The creation of this record is based on the scribe's personal observations and the provider's statements to them. This document has been checked and approved by the attending provider.

## 2015-06-12 ENCOUNTER — Telehealth: Payer: Self-pay | Admitting: *Deleted

## 2015-06-12 NOTE — Telephone Encounter (Signed)
CALLED PATIENT TO INFORM OF GOLD SEED PLACEMENT ON 06-18-15 - ARRIVAL TIME - 12:30 PM @ DR. BORDEN'S OFFICE, LVM FOR A RETURN CALL

## 2015-06-21 ENCOUNTER — Telehealth: Payer: Self-pay | Admitting: *Deleted

## 2015-06-21 NOTE — Telephone Encounter (Signed)
Called patient to inform of sim appt. Being moved from 06-24-15 to 06-28-15, lvm for a return call

## 2015-06-24 ENCOUNTER — Telehealth: Payer: Self-pay | Admitting: *Deleted

## 2015-06-24 ENCOUNTER — Encounter: Payer: Self-pay | Admitting: Medical Oncology

## 2015-06-24 ENCOUNTER — Ambulatory Visit
Admission: RE | Admit: 2015-06-24 | Discharge: 2015-06-24 | Disposition: A | Discharge status: Home / Self Care | Payer: Medicare Other | Source: Ambulatory Visit | Attending: Radiation Oncology | Admitting: Radiation Oncology

## 2015-06-24 ENCOUNTER — Ambulatory Visit: Payer: Medicare Other | Admitting: Radiation Oncology

## 2015-06-24 DIAGNOSIS — Z510 Encounter for antineoplastic radiation therapy: Secondary | ICD-10-CM | POA: Diagnosis not present

## 2015-06-24 DIAGNOSIS — C61 Malignant neoplasm of prostate: Secondary | ICD-10-CM

## 2015-06-24 NOTE — Telephone Encounter (Signed)
Called patient to inform of sim today @ 2 pm, spoke with patient and he is aware of this sim

## 2015-06-24 NOTE — Progress Notes (Signed)
  Radiation Oncology         [phone] ________________________________  Name: Dylan Griffith MRN: [id number]  Date: 06/24/2015  DOB: 1950-08-14  SIMULATION AND TREATMENT PLANNING NOTE  No diagnosis found.  DIAGNOSIS: 65 y.o. gentleman with stage T1c adenocarcinoma of the prostate with a Gleason's score of 3+4 and a PSA of 13.24  NARRATIVE:  The patient was brought to the [facility].  Identity was confirmed.  All relevant records and images related to the planned course of therapy were reviewed.  The patient freely provided informed written consent to proceed with treatment after reviewing the details related to the planned course of therapy. The consent form was witnessed and verified by the simulation staff.  Then, the patient was set-up in a stable reproducible supine position for radiation therapy.  A vacuum lock pillow device was custom fabricated to position his legs in a reproducible immobilized position.  Then, I performed a urethrogram under sterile conditions to identify the prostatic apex.  CT images were obtained.  Surface markings were placed.  The CT images were loaded into the planning software.  Then the prostate target and avoidance structures including the rectum, bladder, bowel and hips were contoured.  Treatment planning then occurred.  The radiation prescription was entered and confirmed.  A total of 1 complex treatment devices were fabricated. I have requested : Intensity Modulated Radiotherapy (IMRT) is medically necessary for this case for the following reason:  Rectal sparing.  PLAN:  The patient will receive 78 Gy in 40 fractions.  ________________________________  Sheral Apley Tammi Klippel, M.D.  This document serves as a record of services personally performed by Tyler Pita, MD. It was created on his behalf by Darcus Austin, a trained medical scribe. The creation of this record is based on the scribe's personal observations and the provider's statements  to them. This document has been checked and approved by the attending provider.

## 2015-06-25 DIAGNOSIS — Z510 Encounter for antineoplastic radiation therapy: Secondary | ICD-10-CM | POA: Diagnosis not present

## 2015-06-26 DIAGNOSIS — Z510 Encounter for antineoplastic radiation therapy: Secondary | ICD-10-CM | POA: Diagnosis not present

## 2015-06-28 ENCOUNTER — Ambulatory Visit: Payer: Medicare Other | Admitting: Radiation Oncology

## 2015-07-01 ENCOUNTER — Encounter: Payer: Self-pay | Admitting: Medical Oncology

## 2015-07-01 NOTE — Progress Notes (Signed)
Oncology Nurse Navigator Documentation  Oncology Nurse Navigator Flowsheets 05/07/2015 06/24/2015 07/01/2015  Navigator Location - - -  Navigator Encounter Type Telephone;MDC Follow-up - Telephone  Telephone - - Incoming Call  Confirmed Diagnosis Date - - -  Treatment Phase - CT SIM -  [facility] [employer] Needs Education Education No Needs  Education Understanding Cancer/ Treatment Options Understanding Cancer/ Treatment Options;Preparing for Upcoming Surgery/ Treatment -  Interventions Education Method Education Method Coordination of Care- Mr. Breitbach called asking about his appointment schedule. He had to reschedule his CT simulation and he just wants to be sure he will not have to change his start date. I informed him that he will start 07/03/15 at 8:15am unless the machine should go down. He has a vacation planned and just does not want to delay the start. He does have a printed calendar of his appointments. I asked him to call me with any questions or concerns.  Coordination of Care - - -  Education Method Verbal Verbal -  Support Groups/Services Prostate Support Group;Friends and Family Prostate Support Group -  Acuity Level 2 Level 1 -  Acuity Level 1 - Initial guidance, education and coordination as needed -  Acuity Level 2 Ongoing guidance and education throughout treatment as needed - -  Acuity Level 3 - - -  Time Spent with Patient 15 30 15

## 2015-07-03 ENCOUNTER — Encounter: Payer: Self-pay | Admitting: Medical Oncology

## 2015-07-03 ENCOUNTER — Ambulatory Visit
Admission: RE | Admit: 2015-07-03 | Discharge: 2015-07-03 | Disposition: A | Discharge status: Home / Self Care | Payer: Medicare Other | Source: Ambulatory Visit | Attending: Radiation Oncology | Admitting: Radiation Oncology

## 2015-07-03 DIAGNOSIS — Z510 Encounter for antineoplastic radiation therapy: Secondary | ICD-10-CM | POA: Diagnosis not present

## 2015-07-03 NOTE — Progress Notes (Signed)
Oncology Nurse Navigator Documentation  Oncology Nurse Navigator Flowsheets 06/24/2015 07/01/2015 07/03/2015  Navigator Location - - -  Navigator Encounter Type - Telephone -  Telephone - Incoming Call -  Confirmed Diagnosis Date - - -  Treatment Initiated Date - - 07/03/2015  Treatment Phase CT SIM - First Radiation Tx  Barriers/Navigation Needs Education No Needs Education  Education Understanding Cancer/ Treatment Options;Preparing for Upcoming Surgery/ Treatment - Pain/ Symptom Management- Dylan Griffith started his radiation therapy today. We discussed the importance of having a comfortable full bladder which will help to lessen side effects. I explained that this may take him a few times to get this down. He voiced understanding. All questions were answered and I asked him to call me with any questions or concerns and he voiced understanding.  Interventions Education Method Coordination of Care Education Method  Coordination of Care - - -  Education Method Verbal - Teach-back;Verbal  Support Groups/Services Prostate Support Group - Prostate Support Group;Friends and Family  Acuity Level 1 - -  Acuity Level 1 Initial guidance, education and coordination as needed - -  Acuity Level 2 - - -  Acuity Level 3 - - -  Time Spent with Patient 30 15 30

## 2015-07-04 ENCOUNTER — Ambulatory Visit
Admission: RE | Admit: 2015-07-04 | Discharge: 2015-07-04 | Disposition: A | Discharge status: Home / Self Care | Payer: Medicare Other | Source: Ambulatory Visit | Attending: Radiation Oncology | Admitting: Radiation Oncology

## 2015-07-04 DIAGNOSIS — Z510 Encounter for antineoplastic radiation therapy: Secondary | ICD-10-CM | POA: Diagnosis not present

## 2015-07-05 ENCOUNTER — Ambulatory Visit
Admission: RE | Admit: 2015-07-05 | Discharge: 2015-07-05 | Disposition: A | Discharge status: Home / Self Care | Payer: Medicare Other | Source: Ambulatory Visit | Attending: Radiation Oncology | Admitting: Radiation Oncology

## 2015-07-05 VITALS — BP 124/76 | HR 67 | Resp 16 | Wt 166.3 lb

## 2015-07-05 DIAGNOSIS — Z510 Encounter for antineoplastic radiation therapy: Secondary | ICD-10-CM | POA: Diagnosis not present

## 2015-07-05 DIAGNOSIS — C61 Malignant neoplasm of prostate: Secondary | ICD-10-CM

## 2015-07-05 NOTE — Progress Notes (Addendum)
Weight and vitals stable. Denies pain. Reports nocturia x 0-1. Denies dysuria or hematuria. Reports occasional hesitancy. Reports for the most part his urine stream is strong and steady without difficulty emptying. Denies urgency or frequency. Reports incontinence or leaking. Denis diarrhea. Denies fatigue. Oriented patient to staff and routine of the clinic. Provided patient with RADIATION THERAPY AND YOU handbook then, reviewed pertinent information. Educated patient reference potential side effects and management such as fatigue, diarrhea and urinary/bladder changes. Encouraged patient to contact this RN with needs. Provided patient with my business card. Patient verbalized understanding of all reviewed.   BP 124/76 mmHg  Pulse 67  Resp 16  Wt 166 lb 4.8 oz (75.433 kg) Wt Readings from Last 3 Encounters:  07/05/15 166 lb 4.8 oz (75.433 kg)  06/10/15 166 lb 14.4 oz (75.705 kg)  04/23/15 164 lb (74.39 kg)

## 2015-07-05 NOTE — Progress Notes (Signed)
  Radiation Oncology         [phone]   Name: Dylan Griffith MRN: [id number]   Date: 07/05/2015  DOB: 08/30/1950     Weekly Radiation Therapy Management    ICD-9-CM ICD-10-CM   1. Prostate cancer ([place]) 185 C61     Current Dose: 5.85 Gy  Planned Dose:  78 Gy  Narrative The patient presents for routine under treatment assessment.  Weight and vitals stable. Denies pain. Reports nocturia x 0-1. Denies dysuria or hematuria. Reports occasional hesitancy. Reports for the most part his urine stream is strong and steady without difficulty emptying. Denies urgency or frequency. Reports incontinence or leaking. Denis diarrhea. Denies fatigue.     The patient is without complaint. Set-up films were reviewed. The chart was checked.  Physical Findings  weight is 166 lb 4.8 oz (75.433 kg). His blood pressure is 124/76 and his pulse is 67. His respiration is 16. . Weight essentially stable.  No significant changes.  Impression The patient is tolerating radiation.  Plan Continue treatment as planned.    Sheral Apley Tammi Klippel, M.D.  This document serves as a record of services personally performed by Tyler Pita, MD. It was created on his behalf by Arlyce Harman, a trained medical scribe. The creation of this record is based on the scribe's personal observations and the provider's statements to them. This document has been checked and approved by the attending provider.

## 2015-07-08 ENCOUNTER — Ambulatory Visit
Admission: RE | Admit: 2015-07-08 | Discharge: 2015-07-08 | Disposition: A | Discharge status: Home / Self Care | Payer: Medicare Other | Source: Ambulatory Visit | Attending: Radiation Oncology | Admitting: Radiation Oncology

## 2015-07-08 DIAGNOSIS — Z510 Encounter for antineoplastic radiation therapy: Secondary | ICD-10-CM | POA: Diagnosis not present

## 2015-07-09 ENCOUNTER — Ambulatory Visit
Admission: RE | Admit: 2015-07-09 | Discharge: 2015-07-09 | Disposition: A | Discharge status: Home / Self Care | Payer: Medicare Other | Source: Ambulatory Visit | Attending: Radiation Oncology | Admitting: Radiation Oncology

## 2015-07-09 DIAGNOSIS — Z510 Encounter for antineoplastic radiation therapy: Secondary | ICD-10-CM | POA: Diagnosis not present

## 2015-07-10 ENCOUNTER — Ambulatory Visit
Admission: RE | Admit: 2015-07-10 | Discharge: 2015-07-10 | Disposition: A | Discharge status: Home / Self Care | Payer: Medicare Other | Source: Ambulatory Visit | Attending: Radiation Oncology | Admitting: Radiation Oncology

## 2015-07-10 DIAGNOSIS — Z510 Encounter for antineoplastic radiation therapy: Secondary | ICD-10-CM | POA: Diagnosis not present

## 2015-07-11 ENCOUNTER — Ambulatory Visit
Admission: RE | Admit: 2015-07-11 | Discharge: 2015-07-11 | Disposition: A | Discharge status: Home / Self Care | Payer: Medicare Other | Source: Ambulatory Visit | Attending: Radiation Oncology | Admitting: Radiation Oncology

## 2015-07-11 DIAGNOSIS — Z510 Encounter for antineoplastic radiation therapy: Secondary | ICD-10-CM | POA: Diagnosis not present

## 2015-07-12 ENCOUNTER — Ambulatory Visit
Admission: RE | Admit: 2015-07-12 | Discharge: 2015-07-12 | Disposition: A | Discharge status: Home / Self Care | Payer: Medicare Other | Source: Ambulatory Visit | Attending: Radiation Oncology | Admitting: Radiation Oncology

## 2015-07-12 ENCOUNTER — Encounter: Payer: Self-pay | Admitting: Radiation Oncology

## 2015-07-12 VITALS — BP 124/74 | HR 60 | Resp 16 | Wt 166.9 lb

## 2015-07-12 DIAGNOSIS — C61 Malignant neoplasm of prostate: Secondary | ICD-10-CM

## 2015-07-12 DIAGNOSIS — Z510 Encounter for antineoplastic radiation therapy: Secondary | ICD-10-CM | POA: Diagnosis not present

## 2015-07-12 NOTE — Progress Notes (Signed)
Weight and vitals stable. Denies pain. Reports increased frequency of urination. Reports occasional hesitancy. Reports mild dysuria. Denies hematuria. Reports a couple of time he has felt as though he didn't empty his bladder completely. Reports nocturia x 1. Denies fatigue. Denies diarrhea.   BP 124/74 mmHg  Pulse 60  Resp 16  Wt 166 lb 14.4 oz (75.705 kg)  SpO2 100% Wt Readings from Last 3 Encounters:  07/12/15 166 lb 14.4 oz (75.705 kg)  07/05/15 166 lb 4.8 oz (75.433 kg)  06/10/15 166 lb 14.4 oz (75.705 kg)

## 2015-07-15 ENCOUNTER — Ambulatory Visit
Admission: RE | Admit: 2015-07-15 | Discharge: 2015-07-15 | Disposition: A | Discharge status: Home / Self Care | Payer: Medicare Other | Source: Ambulatory Visit | Attending: Radiation Oncology | Admitting: Radiation Oncology

## 2015-07-15 ENCOUNTER — Encounter: Payer: Self-pay | Admitting: Medical Oncology

## 2015-07-15 DIAGNOSIS — Z510 Encounter for antineoplastic radiation therapy: Secondary | ICD-10-CM | POA: Diagnosis not present

## 2015-07-15 NOTE — Progress Notes (Signed)
   Department of Radiation Oncology  Phone:  [phone] Fax:        [phone]  Weekly Treatment Note    Name: Dylan Griffith Date: 07/15/2015 MRN: [id number] DOB: July 08, 1950   Diagnosis:     ICD-9-CM ICD-10-CM   1. Prostate cancer ([place]) 185 C61      Current dose: 15.6 Gy  Current fraction: 8   MEDICATIONS: Current Outpatient Prescriptions  Medication Sig Dispense Refill  . amLODipine-benazepril (LOTREL) 10-20 MG capsule Take 1 capsule by mouth daily.    . Ascorbic Acid (VITAMIN C) 1000 MG tablet Take 1,000 mg by mouth daily.    . diazepam (VALIUM) 10 MG tablet Reported on 06/10/2015  0  . VITAMIN D, CHOLECALCIFEROL, PO Take by mouth.     No current facility-administered medications for this encounter.     ALLERGIES: Review of patient's allergies indicates no known allergies.   LABORATORY DATA:  No results found for: WBC, HGB, HCT, MCV, PLT No results found for: NA, K, CL, CO2 No results found for: ALT, AST, GGT, ALKPHOS, BILITOT   NARRATIVE: Zay C Klabunde was seen today for weekly treatment management. The chart was checked and the patient's films were reviewed.  Weight and vitals stable. Denies pain. Reports increased frequency of urination. Reports occasional hesitancy. Reports mild dysuria. Denies hematuria. Reports a couple of time he has felt as though he didn't empty his bladder completely. Reports nocturia x 1. Denies fatigue. Denies diarrhea.   BP 124/74 mmHg  Pulse 60  Resp 16  Wt 166 lb 14.4 oz (75.705 kg)  SpO2 100% Wt Readings from Last 3 Encounters:  07/12/15 166 lb 14.4 oz (75.705 kg)  07/05/15 166 lb 4.8 oz (75.433 kg)  06/10/15 166 lb 14.4 oz (75.705 kg)     PHYSICAL EXAMINATION: weight is 166 lb 14.4 oz (75.705 kg). His blood pressure is 124/74 and his pulse is 60. His respiration is 16 and oxygen saturation is 100%.        ASSESSMENT: The patient is doing satisfactorily with treatment.  PLAN: We will continue with the patient's  radiation treatment as planned. Doing well without any major GU changes at this point.

## 2015-07-16 ENCOUNTER — Ambulatory Visit
Admission: RE | Admit: 2015-07-16 | Discharge: 2015-07-16 | Disposition: A | Discharge status: Home / Self Care | Payer: Medicare Other | Source: Ambulatory Visit | Attending: Radiation Oncology | Admitting: Radiation Oncology

## 2015-07-16 DIAGNOSIS — Z510 Encounter for antineoplastic radiation therapy: Secondary | ICD-10-CM | POA: Diagnosis not present

## 2015-07-17 ENCOUNTER — Ambulatory Visit
Admission: RE | Admit: 2015-07-17 | Discharge: 2015-07-17 | Disposition: A | Discharge status: Home / Self Care | Payer: Medicare Other | Source: Ambulatory Visit | Attending: Radiation Oncology | Admitting: Radiation Oncology

## 2015-07-17 DIAGNOSIS — Z510 Encounter for antineoplastic radiation therapy: Secondary | ICD-10-CM | POA: Diagnosis not present

## 2015-07-18 ENCOUNTER — Ambulatory Visit
Admission: RE | Admit: 2015-07-18 | Discharge: 2015-07-18 | Disposition: A | Discharge status: Home / Self Care | Payer: Medicare Other | Source: Ambulatory Visit | Attending: Radiation Oncology | Admitting: Radiation Oncology

## 2015-07-18 DIAGNOSIS — Z510 Encounter for antineoplastic radiation therapy: Secondary | ICD-10-CM | POA: Diagnosis not present

## 2015-07-19 ENCOUNTER — Encounter: Payer: Self-pay | Admitting: Radiation Oncology

## 2015-07-19 ENCOUNTER — Ambulatory Visit
Admission: RE | Admit: 2015-07-19 | Discharge: 2015-07-19 | Disposition: A | Discharge status: Home / Self Care | Payer: Medicare Other | Source: Ambulatory Visit | Attending: Radiation Oncology | Admitting: Radiation Oncology

## 2015-07-19 VITALS — BP 124/72 | HR 60 | Resp 16 | Wt 166.6 lb

## 2015-07-19 DIAGNOSIS — C61 Malignant neoplasm of prostate: Secondary | ICD-10-CM

## 2015-07-19 DIAGNOSIS — Z510 Encounter for antineoplastic radiation therapy: Secondary | ICD-10-CM | POA: Diagnosis not present

## 2015-07-19 NOTE — Progress Notes (Signed)
  Radiation Oncology         [phone]   Name: Dylan Griffith MRN: [id number]   Date: 07/19/2015  DOB: [birth year]     Weekly Radiation Therapy Management    ICD-9-CM ICD-10-CM   1. Prostate cancer ([place]) 185 C61     Current Dose: 25.35 Gy  Planned Dose:  78 Gy  Narrative The patient presents for routine under treatment assessment.  Weight and vitals stable. Denies pain. Reports urinary frequency and occasional hesitancy. Reports mild dysuria continues. Denies hematuria. Reports occasional episodes of difficulty emptying his bladder. Reports nocturia x 1-2. Denies fatigue. Denies diarrhea.     The patient is without complaint. Set-up films were reviewed. The chart was checked.  Physical Findings  weight is 166 lb 9.6 oz (75.569 kg). His blood pressure is 124/72 and his pulse is 60. His respiration is 16 and oxygen saturation is 100%. . Weight essentially stable.  No significant changes.  Impression The patient is tolerating radiation.  Plan Continue treatment as planned.    Sheral Apley Tammi Klippel, M.D.  This document serves as a record of services personally performed by Tyler Pita, MD. It was created on his behalf by Arlyce Harman, a trained medical scribe. The creation of this record is based on the scribe's personal observations and the provider's statements to them. This document has been checked and approved by the attending provider.

## 2015-07-19 NOTE — Progress Notes (Signed)
Weight and vitals stable. Denies pain. Reports urinary frequency and occasional hesitancy. Reports mild dysuria continues. Denies hematuria. Reports occasional episodes of difficulty emptying his bladder. Reports nocturia x 1. Denies fatigue. Denies diarrhea.   BP 124/72 mmHg  Pulse 60  Resp 16  Wt 166 lb 9.6 oz (75.569 kg)  SpO2 100% Wt Readings from Last 3 Encounters:  07/19/15 166 lb 9.6 oz (75.569 kg)  07/12/15 166 lb 14.4 oz (75.705 kg)  07/05/15 166 lb 4.8 oz (75.433 kg)

## 2015-07-22 ENCOUNTER — Encounter: Payer: Self-pay | Admitting: Medical Oncology

## 2015-07-22 ENCOUNTER — Ambulatory Visit
Admission: RE | Admit: 2015-07-22 | Discharge: 2015-07-22 | Disposition: A | Discharge status: Home / Self Care | Payer: Medicare Other | Source: Ambulatory Visit | Attending: Radiation Oncology | Admitting: Radiation Oncology

## 2015-07-22 DIAGNOSIS — Z510 Encounter for antineoplastic radiation therapy: Secondary | ICD-10-CM | POA: Diagnosis not present

## 2015-07-22 NOTE — Progress Notes (Signed)
Oncology Nurse Navigator Documentation  Oncology Nurse Navigator Flowsheets 07/03/2015 07/15/2015 07/22/2015  Navigator Location - - -  Navigator Encounter Type - - Treatment  Telephone - - -  Confirmed Diagnosis Date - - -  Treatment Initiated Date 07/03/2015 - -  Treatment Phase First Radiation Tx Treatment Treatment  Barriers/Navigation Needs Education No barriers at this time -  Education Pain/ Symptom Management;Preparing for Upcoming Surgery/ Treatment - Pain/ Symptom Management- Dylan Griffith states that he has been doing well with mild dysuria and occasionally not emptying his bladder. This week-end he  Had one day where he noted that both of these side effects were worse. Dr. Tammi Klippel wants him to monitor and chart his activity.Today is day 14/40. I will continue to follow and ask him to call me with any questions or concerns. He voiced  understanding.  Interventions Education Method - Education Method  Coordination of Care - - -  Education Method Teach-back;Verbal - Teach-back;Verbal  SuppoGroups/Services Prostate Support Group;Friends and Family Prostate Support Group Prostate Support Group  Acuity - - -  Acuity Level 1 - - -  Acuity Level 2 - - -  Acuity Level 3 - - -  Time Spent with Patient 30 15 15

## 2015-07-22 NOTE — Progress Notes (Addendum)
Dylan Griffith seen today with report of "intermittent" increase in urination with need to void within a 30 minute period after initial void.  Reports mild dysuria.  Nocturia 2-3 times.  Relayed information to [employer], [place]. who will monitor status prior to medication intervention. Requested Dylan Griffith inform nursing if urination pattern and symptomology worsens.  Will recheck status midweek.

## 2015-07-23 ENCOUNTER — Ambulatory Visit
Admission: RE | Admit: 2015-07-23 | Discharge: 2015-07-23 | Disposition: A | Discharge status: Home / Self Care | Payer: Medicare Other | Source: Ambulatory Visit | Attending: Radiation Oncology | Admitting: Radiation Oncology

## 2015-07-23 DIAGNOSIS — Z510 Encounter for antineoplastic radiation therapy: Secondary | ICD-10-CM | POA: Diagnosis not present

## 2015-07-24 ENCOUNTER — Ambulatory Visit
Admission: RE | Admit: 2015-07-24 | Discharge: 2015-07-24 | Disposition: A | Discharge status: Home / Self Care | Payer: Medicare Other | Source: Ambulatory Visit | Attending: Radiation Oncology | Admitting: Radiation Oncology

## 2015-07-24 DIAGNOSIS — Z510 Encounter for antineoplastic radiation therapy: Secondary | ICD-10-CM | POA: Diagnosis not present

## 2015-07-25 ENCOUNTER — Encounter: Payer: Self-pay | Admitting: Radiation Oncology

## 2015-07-25 ENCOUNTER — Ambulatory Visit
Admission: RE | Admit: 2015-07-25 | Discharge: 2015-07-25 | Disposition: A | Discharge status: Home / Self Care | Payer: Medicare Other | Source: Ambulatory Visit | Attending: Radiation Oncology | Admitting: Radiation Oncology

## 2015-07-25 ENCOUNTER — Encounter: Payer: Self-pay | Admitting: Medical Oncology

## 2015-07-25 VITALS — BP 126/79 | HR 65 | Resp 16 | Wt 166.3 lb

## 2015-07-25 DIAGNOSIS — C61 Malignant neoplasm of prostate: Secondary | ICD-10-CM

## 2015-07-25 DIAGNOSIS — Z510 Encounter for antineoplastic radiation therapy: Secondary | ICD-10-CM | POA: Diagnosis not present

## 2015-07-25 NOTE — Progress Notes (Signed)
  Radiation Oncology         [phone]   Name: Dylan Griffith MRN: [id number]   Date: 07/25/2015  DOB: March 31, 1950     Weekly Radiation Therapy Management    ICD-9-CM ICD-10-CM   1. Prostate cancer ([place]) 185 C61     Current Dose: 33.15 Gy  Planned Dose:  78 Gy  Narrative The patient presents for routine under treatment assessment.  Weight and vitals stable. Denies pain. Reports urinary frequency and occasional hesitancy. Reports mild dysuria now during the day and night time hour. Denies hematuria. Reports occasional episodes of difficulty emptying his bladder. Reports nocturia x 2. Patient questions if medication management is necessary at this time to control frequency. He states, "I don't want to take any more medication if I don't have to and my symptoms are tolerable now but, I am curious if there is anyway the frequency could be less." Denies diarrhea. He mentions he has some fatigue. He walks 2 miles twice a week.  The patient is without complaint. Set-up films were reviewed. The chart was checked.  Physical Findings  weight is 166 lb 4.8 oz (75.433 kg). His blood pressure is 126/79 and his pulse is 65. His respiration is 16 and oxygen saturation is 100%. . Weight essentially stable.  No significant changes.  Impression The patient is tolerating radiation.  Plan Continue treatment as planned. We discussed medication he can take if the nocturia is bothering him.    Sheral Apley Tammi Klippel, M.D.    This document serves as a record of services personally performed by Tyler Pita, MD. It was created on his behalf by Lendon Collar, a trained medical scribe. The creation of this record is based on the scribe's personal observations and the provider's statements to them. This document has been checked and approved by the attending provider.

## 2015-07-25 NOTE — Progress Notes (Addendum)
Weight and vitals stable. Denies pain. Reports urinary frequency and occasional hesitancy. Reports mild dysuria now during the day and night time hour. Denies hematuria. Reports occasional episodes of difficulty emptying his bladder. Reports nocturia x 2. Patient questions if medication management is necessary at this time to control frequency. He states, "I don't want to take any more medication if I don't have to and my symptoms are tolerable now but, I am curious if there is anyway the frequency could be less. Denies fatigue. Denies diarrhea.   BP 126/79 mmHg  Pulse 65  Resp 16  Wt 166 lb 4.8 oz (75.433 kg)  SpO2 100% Wt Readings from Last 3 Encounters:  07/25/15 166 lb 4.8 oz (75.433 kg)  07/19/15 166 lb 9.6 oz (75.569 kg)  07/12/15 166 lb 14.4 oz (75.705 kg)

## 2015-07-25 NOTE — Progress Notes (Signed)
Oncology Nurse Navigator Documentation  Oncology Nurse Navigator Flowsheets 07/15/2015 07/22/2015 07/25/2015  Navigator Location - - -  Navigator Encounter Type - Treatment Treatment- Dylan Griffith is doing well. He states he has some mild fatigue but has been walking about 2 miles daily. He had nocturia x 2. He is pleased with his progress. 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 I will continue to follow him and asked him to call me with any questions or concerns.  Telephone - - -  Confirmed Diagnosis Date - - -  Treatment Initiated Date - - -  Patient Visit Type - - RadOnc;Follow-up  Treatment Phase Treatment Treatment Treatment  Barriers/Navigation Needs No barriers at this time - No barriers at this time  Education - Pain/ Symptom Management -  Interventions - Education Method -  Coordination of Care - - -  Education Method - Teach-back;Verbal -  Support Groups/Services Prostate Support Group Prostate Support Group -  Acuity - - -  Acuity Level 1 - - -  Acuity Level 2 - - -  Acuity Level 3 - - -  Time Spent with Patient 15 15 30

## 2015-07-26 ENCOUNTER — Ambulatory Visit
Admission: RE | Admit: 2015-07-26 | Discharge: 2015-07-26 | Disposition: A | Discharge status: Home / Self Care | Payer: Medicare Other | Source: Ambulatory Visit | Attending: Radiation Oncology | Admitting: Radiation Oncology

## 2015-07-26 DIAGNOSIS — Z510 Encounter for antineoplastic radiation therapy: Secondary | ICD-10-CM | POA: Diagnosis not present

## 2015-07-29 ENCOUNTER — Ambulatory Visit
Admission: RE | Admit: 2015-07-29 | Discharge: 2015-07-29 | Disposition: A | Discharge status: Home / Self Care | Payer: Medicare Other | Source: Ambulatory Visit | Attending: Radiation Oncology | Admitting: Radiation Oncology

## 2015-07-29 DIAGNOSIS — Z510 Encounter for antineoplastic radiation therapy: Secondary | ICD-10-CM | POA: Diagnosis not present

## 2015-07-30 ENCOUNTER — Ambulatory Visit
Admission: RE | Admit: 2015-07-30 | Discharge: 2015-07-30 | Disposition: A | Discharge status: Home / Self Care | Payer: Medicare Other | Source: Ambulatory Visit | Attending: Radiation Oncology | Admitting: Radiation Oncology

## 2015-07-30 DIAGNOSIS — Z510 Encounter for antineoplastic radiation therapy: Secondary | ICD-10-CM | POA: Diagnosis not present

## 2015-07-31 ENCOUNTER — Ambulatory Visit
Admission: RE | Admit: 2015-07-31 | Discharge: 2015-07-31 | Disposition: A | Discharge status: Home / Self Care | Payer: Medicare Other | Source: Ambulatory Visit | Attending: Radiation Oncology | Admitting: Radiation Oncology

## 2015-07-31 DIAGNOSIS — Z510 Encounter for antineoplastic radiation therapy: Secondary | ICD-10-CM | POA: Diagnosis not present

## 2015-08-01 ENCOUNTER — Ambulatory Visit
Admission: RE | Admit: 2015-08-01 | Discharge: 2015-08-01 | Disposition: A | Discharge status: Home / Self Care | Payer: Medicare Other | Source: Ambulatory Visit | Attending: Radiation Oncology | Admitting: Radiation Oncology

## 2015-08-01 VITALS — BP 116/78 | HR 60 | Temp 98.4°F | Resp 10 | Wt 166.7 lb

## 2015-08-01 DIAGNOSIS — Z510 Encounter for antineoplastic radiation therapy: Secondary | ICD-10-CM | POA: Diagnosis not present

## 2015-08-01 DIAGNOSIS — C61 Malignant neoplasm of prostate: Secondary | ICD-10-CM

## 2015-08-01 NOTE — Progress Notes (Signed)
PAIN: He is currently in no pain.  URINARY: Pt reports urinary slight pain with urination/burning, urine is clear yellow. Pt states they urinate 1 - 2 times per night.  BOWEL: Pt reports  a soft bowel movement everyday/everyother day. OTHER: Pt complains of fatigue BP 116/78 mmHg  Pulse 60  Temp(Src) 98.4 F (36.9 C) (Oral)  Resp 10  Wt 166 lb 11.2 oz (75.615 kg)  SpO2 100% Wt Readings from Last 3 Encounters:  08/01/15 166 lb 11.2 oz (75.615 kg)  07/25/15 166 lb 4.8 oz (75.433 kg)  07/19/15 166 lb 9.6 oz (75.569 kg)

## 2015-08-01 NOTE — Progress Notes (Signed)
  Radiation Oncology         [phone]   Name: Dylan Griffith MRN: [id number]   Date: 08/01/2015  DOB: 01/14/1951     Weekly Radiation Therapy Management    ICD-9-CM ICD-10-CM   1. Prostate cancer ([place]) 185 C61     Current Dose: 42.9 Gy  Planned Dose:  78 Gy  Narrative The patient presents for routine under treatment assessment.  PAIN: He is currently in no pain.  URINARY: Pt reports urinary slight pain with urination/burning, urine is clear yellow. Pt states they urinate 1 - 2 times per night.  BOWEL: Pt reports  a soft bowel movement everyday/everyother day. OTHER: Pt complains of fatigue  The patient is without complaint. Set-up films were reviewed. The chart was checked.  Physical Findings  weight is 166 lb 11.2 oz (75.615 kg). His oral temperature is 98.4 F (36.9 C). His blood pressure is 116/78 and his pulse is 60. His respiration is 10 and oxygen saturation is 100%. . Weight essentially stable.  No significant changes.  Impression The patient is tolerating radiation.  Plan Continue treatment as planned. We discussed possibility for taking Flomax and side effects.    Sheral Apley Tammi Klippel, M.D.   This document serves as a record of services personally performed by Tyler Pita, MD. It was created on his behalf by Derek Mound, a trained medical scribe. The creation of this record is based on the scribe's personal observations and the provider's statements to them. This document has been checked and approved by the attending provider.

## 2015-08-02 ENCOUNTER — Ambulatory Visit
Admission: RE | Admit: 2015-08-02 | Discharge: 2015-08-02 | Disposition: A | Discharge status: Home / Self Care | Payer: Medicare Other | Source: Ambulatory Visit | Attending: Radiation Oncology | Admitting: Radiation Oncology

## 2015-08-02 DIAGNOSIS — Z510 Encounter for antineoplastic radiation therapy: Secondary | ICD-10-CM | POA: Diagnosis not present

## 2015-08-05 ENCOUNTER — Ambulatory Visit
Admission: RE | Admit: 2015-08-05 | Discharge: 2015-08-05 | Disposition: A | Discharge status: Home / Self Care | Payer: Medicare Other | Source: Ambulatory Visit | Attending: Radiation Oncology | Admitting: Radiation Oncology

## 2015-08-05 DIAGNOSIS — Z510 Encounter for antineoplastic radiation therapy: Secondary | ICD-10-CM | POA: Diagnosis not present

## 2015-08-06 ENCOUNTER — Ambulatory Visit
Admission: RE | Admit: 2015-08-06 | Discharge: 2015-08-06 | Disposition: A | Discharge status: Home / Self Care | Payer: Medicare Other | Source: Ambulatory Visit | Attending: Radiation Oncology | Admitting: Radiation Oncology

## 2015-08-06 DIAGNOSIS — Z510 Encounter for antineoplastic radiation therapy: Secondary | ICD-10-CM | POA: Diagnosis not present

## 2015-08-07 ENCOUNTER — Ambulatory Visit
Admission: RE | Admit: 2015-08-07 | Discharge: 2015-08-07 | Disposition: A | Discharge status: Home / Self Care | Payer: Medicare Other | Source: Ambulatory Visit | Attending: Radiation Oncology | Admitting: Radiation Oncology

## 2015-08-07 DIAGNOSIS — Z510 Encounter for antineoplastic radiation therapy: Secondary | ICD-10-CM | POA: Diagnosis not present

## 2015-08-08 ENCOUNTER — Ambulatory Visit
Admission: RE | Admit: 2015-08-08 | Discharge: 2015-08-08 | Disposition: A | Discharge status: Home / Self Care | Payer: Medicare Other | Source: Ambulatory Visit | Attending: Radiation Oncology | Admitting: Radiation Oncology

## 2015-08-08 ENCOUNTER — Encounter: Payer: Self-pay | Admitting: Radiation Oncology

## 2015-08-08 VITALS — BP 121/73 | HR 58 | Temp 98.1°F | Resp 16 | Wt 166.8 lb

## 2015-08-08 DIAGNOSIS — C61 Malignant neoplasm of prostate: Secondary | ICD-10-CM

## 2015-08-08 DIAGNOSIS — Z510 Encounter for antineoplastic radiation therapy: Secondary | ICD-10-CM | POA: Diagnosis not present

## 2015-08-08 NOTE — Progress Notes (Signed)
Weight and vitals stable. Denies pain. Reports urinary frequency continues. Reports nocturia x 2-3. Reports mild dysuria. Denies hematuria. Reports occasional hesitancy continues. Reports occasional difficulty emptying his bladder completely. Denies diarrhea. Report mild fatigue.  BP 121/73 mmHg  Pulse 58  Resp 16  Wt 166 lb 12.8 oz (75.66 kg)  SpO2 100% Wt Readings from Last 3 Encounters:  08/08/15 166 lb 12.8 oz (75.66 kg)  08/01/15 166 lb 11.2 oz (75.615 kg)  07/25/15 166 lb 4.8 oz (75.433 kg)

## 2015-08-08 NOTE — Progress Notes (Signed)
  Radiation Oncology         [phone]   Name: Dylan Griffith MRN: [id number]   Date: 08/08/2015  DOB: Feb 06, 1951     Weekly Radiation Therapy Management    ICD-9-CM ICD-10-CM   1. Prostate cancer ([place]) 185 C61     Current Dose: 52.65 Gy  Planned Dose:  78 Gy  Narrative The patient presents for routine under treatment assessment.  Weight and vitals stable. Denies pain. He reports that urinary frequency continues. Reports nocturia x 2-3. Reports mild dysuria. Denies hematuria. He reports that occasional hesitancy continues. Reports occasional difficulty emptying his bladder completely. Denies diarrhea. Reports mild fatigue. He reports occasional aches and pains in the pelvic region, but states that this usually resolves within 2 minutes.   Set-up films were reviewed. The chart was checked.  Physical Findings  weight is 166 lb 12.8 oz (75.66 kg). His oral temperature is 98.1 F (36.7 C). His blood pressure is 121/73 and his pulse is 58. His respiration is 16 and oxygen saturation is 100%.  Weight essentially stable. This is a pleasant male in no acute distress. He is alert and oriented.   Impression The patient is tolerating radiation.  Plan Continue treatment as planned.    Sheral Apley Tammi Klippel, M.D.  This document serves as a record of services personally performed by Shona Simpson, PA and Tyler Pita, MD. It was created on their behalf by Jenell Milliner, a trained medical scribe. The creation of this record is based on the scribe's personal observations and the provider's statements to them. This document has been checked and approved by the attending provider.

## 2015-08-09 ENCOUNTER — Ambulatory Visit
Admission: RE | Admit: 2015-08-09 | Discharge: 2015-08-09 | Disposition: A | Discharge status: Home / Self Care | Payer: Medicare Other | Source: Ambulatory Visit | Attending: Radiation Oncology | Admitting: Radiation Oncology

## 2015-08-09 DIAGNOSIS — Z510 Encounter for antineoplastic radiation therapy: Secondary | ICD-10-CM | POA: Diagnosis not present

## 2015-08-12 ENCOUNTER — Encounter: Payer: Self-pay | Admitting: Medical Oncology

## 2015-08-12 ENCOUNTER — Ambulatory Visit
Admission: RE | Admit: 2015-08-12 | Discharge: 2015-08-12 | Disposition: A | Discharge status: Home / Self Care | Payer: Medicare Other | Source: Ambulatory Visit | Attending: Radiation Oncology | Admitting: Radiation Oncology

## 2015-08-12 DIAGNOSIS — Z510 Encounter for antineoplastic radiation therapy: Secondary | ICD-10-CM | POA: Diagnosis not present

## 2015-08-13 ENCOUNTER — Ambulatory Visit
Admission: RE | Admit: 2015-08-13 | Discharge: 2015-08-13 | Disposition: A | Discharge status: Home / Self Care | Payer: Medicare Other | Source: Ambulatory Visit | Attending: Radiation Oncology | Admitting: Radiation Oncology

## 2015-08-13 DIAGNOSIS — Z510 Encounter for antineoplastic radiation therapy: Secondary | ICD-10-CM | POA: Diagnosis not present

## 2015-08-14 ENCOUNTER — Ambulatory Visit
Admission: RE | Admit: 2015-08-14 | Discharge: 2015-08-14 | Disposition: A | Discharge status: Home / Self Care | Payer: Medicare Other | Source: Ambulatory Visit | Attending: Radiation Oncology | Admitting: Radiation Oncology

## 2015-08-14 DIAGNOSIS — Z510 Encounter for antineoplastic radiation therapy: Secondary | ICD-10-CM | POA: Diagnosis not present

## 2015-08-15 ENCOUNTER — Ambulatory Visit
Admission: RE | Admit: 2015-08-15 | Discharge: 2015-08-15 | Disposition: A | Discharge status: Home / Self Care | Payer: Medicare Other | Source: Ambulatory Visit | Attending: Radiation Oncology | Admitting: Radiation Oncology

## 2015-08-15 ENCOUNTER — Encounter: Payer: Self-pay | Admitting: Radiation Oncology

## 2015-08-15 VITALS — BP 120/74 | HR 65 | Resp 16 | Wt 166.2 lb

## 2015-08-15 DIAGNOSIS — C61 Malignant neoplasm of prostate: Secondary | ICD-10-CM

## 2015-08-15 DIAGNOSIS — Z510 Encounter for antineoplastic radiation therapy: Secondary | ICD-10-CM | POA: Diagnosis not present

## 2015-08-15 NOTE — Progress Notes (Signed)
  Radiation Oncology         [phone]   Name: Dylan Griffith MRN: [id number]   Date: 08/15/2015  DOB: [birth year]     Weekly Radiation Therapy Management    ICD-9-CM ICD-10-CM   1. Prostate cancer ([employer]) 185 C61     Current Dose: 62.4 Gy  Planned Dose:  78 Gy  Narrative The patient presents for routine under treatment assessment.  Weight and vitals stable. Denies pain. Reports urinary frequency continues. Reports nocturia x 2-3. Reports mild dysuria. Denies hematuria. Reports occasional hesitancy continues. Reports occasional difficulty emptying his bladder completely. Denies diarrhea. Report mild fatigue.  Set-up films were reviewed. The chart was checked.  Physical Findings  weight is 166 lb 3.2 oz (75.388 kg). His blood pressure is 120/74 and his pulse is 65. His respiration is 16 and oxygen saturation is 100%.  Weight essentially stable. This is a pleasant male in no acute distress. He is alert and oriented.   Impression The patient is tolerating radiation.  Plan Continue treatment as planned.    Sheral Apley Tammi Klippel, M.D.    This document serves as a record of services personally performed by Tyler Pita, MD. It was created on his behalf by Lendon Collar, a trained medical scribe. The creation of this record is based on the scribe's personal observations and the provider's statements to them. This document has been checked and approved by the attending provider.

## 2015-08-15 NOTE — Progress Notes (Signed)
Weight and vitals stable. Denies pain. Reports urinary frequency continues. Reports nocturia x 2-3. Reports mild dysuria. Denies hematuria. Reports occasional hesitancy continues. Reports occasional difficulty emptying his bladder completely. Denies diarrhea. Report mild fatigue.  BP 120/74 mmHg  Pulse 65  Resp 16  Wt 166 lb 3.2 oz (75.388 kg)  SpO2 100% Wt Readings from Last 3 Encounters:  08/15/15 166 lb 3.2 oz (75.388 kg)  08/08/15 166 lb 12.8 oz (75.66 kg)  08/01/15 166 lb 11.2 oz (75.615 kg)

## 2015-08-16 ENCOUNTER — Ambulatory Visit
Admission: RE | Admit: 2015-08-16 | Discharge: 2015-08-16 | Disposition: A | Discharge status: Home / Self Care | Payer: Medicare Other | Source: Ambulatory Visit | Attending: Radiation Oncology | Admitting: Radiation Oncology

## 2015-08-16 DIAGNOSIS — Z510 Encounter for antineoplastic radiation therapy: Secondary | ICD-10-CM | POA: Diagnosis not present

## 2015-08-19 ENCOUNTER — Ambulatory Visit
Admission: RE | Admit: 2015-08-19 | Discharge: 2015-08-19 | Disposition: A | Discharge status: Home / Self Care | Payer: Medicare Other | Source: Ambulatory Visit | Attending: Radiation Oncology | Admitting: Radiation Oncology

## 2015-08-19 DIAGNOSIS — Z510 Encounter for antineoplastic radiation therapy: Secondary | ICD-10-CM | POA: Diagnosis not present

## 2015-08-20 ENCOUNTER — Ambulatory Visit
Admission: RE | Admit: 2015-08-20 | Discharge: 2015-08-20 | Disposition: A | Discharge status: Home / Self Care | Payer: Medicare Other | Source: Ambulatory Visit | Attending: Radiation Oncology | Admitting: Radiation Oncology

## 2015-08-20 DIAGNOSIS — Z510 Encounter for antineoplastic radiation therapy: Secondary | ICD-10-CM | POA: Diagnosis not present

## 2015-08-21 ENCOUNTER — Ambulatory Visit
Admission: RE | Admit: 2015-08-21 | Discharge: 2015-08-21 | Disposition: A | Discharge status: Home / Self Care | Payer: Medicare Other | Source: Ambulatory Visit | Attending: Radiation Oncology | Admitting: Radiation Oncology

## 2015-08-21 DIAGNOSIS — Z510 Encounter for antineoplastic radiation therapy: Secondary | ICD-10-CM | POA: Diagnosis not present

## 2015-08-22 ENCOUNTER — Ambulatory Visit
Admission: RE | Admit: 2015-08-22 | Discharge: 2015-08-22 | Disposition: A | Discharge status: Home / Self Care | Payer: Medicare Other | Source: Ambulatory Visit | Attending: Radiation Oncology | Admitting: Radiation Oncology

## 2015-08-22 ENCOUNTER — Encounter: Payer: Self-pay | Admitting: Radiation Oncology

## 2015-08-22 VITALS — BP 117/73 | HR 62 | Resp 16 | Wt 165.9 lb

## 2015-08-22 DIAGNOSIS — Z510 Encounter for antineoplastic radiation therapy: Secondary | ICD-10-CM | POA: Diagnosis not present

## 2015-08-22 DIAGNOSIS — C61 Malignant neoplasm of prostate: Secondary | ICD-10-CM

## 2015-08-22 NOTE — Progress Notes (Addendum)
Weight and vitals stable. Denies pain. Reports urinary frequency continues. Reports nocturia x 2-3. Reports mild dysuria. Denies hematuria. Reports occasional hesitancy continues. Reports occasional difficulty emptying his bladder completely. Denies diarrhea. Report mild fatigue. Patient complete treatment Wednesday coming. One month follow up appointment card given. Patient understands to contact this RN with future needs. Patient scheduled to follow up with Dr. Alinda Money in October.    BP 117/73 mmHg  Pulse 62  Resp 16  Wt 165 lb 14.4 oz (75.252 kg) Wt Readings from Last 3 Encounters:  08/22/15 165 lb 14.4 oz (75.252 kg)  08/15/15 166 lb 3.2 oz (75.388 kg)  08/08/15 166 lb 12.8 oz (75.66 kg)

## 2015-08-22 NOTE — Progress Notes (Signed)
  Radiation Oncology         [phone]   Name: Dylan Griffith MRN: [id number]   Date: 08/22/2015  DOB: 02/02/1951     Weekly Radiation Therapy Management    ICD-9-CM ICD-10-CM   1. Prostate cancer ([place]) 185 C61     Current Dose: 72.15 Gy  Planned Dose:  78 Gy  Narrative The patient presents for routine under treatment assessment.  Denies pain, hematuria, or diarrhea. Reports urinary frequency continues, nocturia x 2-3, mild dysuria, occasional hesitancy continues, occasional difficulty emptying his bladder completely, and mild fatigue.  Set-up films were reviewed. The chart was checked.  Physical Findings  weight is 165 lb 14.4 oz (75.252 kg). His blood pressure is 117/73 and his pulse is 62. His respiration is 16.   In general this is a well appearing African-American male in no acute distress. He's alert and oriented x4 and appropriate throughout the examination. Cardiopulmonary assessment is negative for acute distress and he exhibits normal effort.    Impression The patient is tolerating radiation.  Plan Continue treatment as planned. The patient is expected to complete radiation treatment next Wednesday. A one month follow up appointment card was given by the nurse. His appointment with Dr. Alinda Money is scheduled in October. I would like the patient to see Dr. Alinda Money sooner (July/August) to get his PSA.   ------------------------------------------------   Tyler Pita, MD [facility] Director and Director of Stereotactic Radiosurgery Direct Dial: [phone]  Fax: [phone] [email or web address]  Skype  [email or web address]  This document serves as a record of services personally performed by Tyler Pita, MD. It was created on her behalf by Darcus Austin, a trained medical scribe. The creation of this record is based on the scribe's personal observations and the providers' statements to them. This document has been checked and approved by the attending  provider.

## 2015-08-23 ENCOUNTER — Ambulatory Visit
Admission: RE | Admit: 2015-08-23 | Discharge: 2015-08-23 | Disposition: A | Discharge status: Home / Self Care | Payer: Medicare Other | Source: Ambulatory Visit | Attending: Radiation Oncology | Admitting: Radiation Oncology

## 2015-08-23 DIAGNOSIS — Z510 Encounter for antineoplastic radiation therapy: Secondary | ICD-10-CM | POA: Diagnosis not present

## 2015-08-27 ENCOUNTER — Ambulatory Visit
Admission: RE | Admit: 2015-08-27 | Discharge: 2015-08-27 | Disposition: A | Discharge status: Home / Self Care | Payer: Medicare Other | Source: Ambulatory Visit | Attending: Radiation Oncology | Admitting: Radiation Oncology

## 2015-08-27 ENCOUNTER — Ambulatory Visit: Payer: Self-pay | Admitting: Radiation Oncology

## 2015-08-27 DIAGNOSIS — Z510 Encounter for antineoplastic radiation therapy: Secondary | ICD-10-CM | POA: Diagnosis not present

## 2015-08-28 ENCOUNTER — Encounter: Payer: Self-pay | Admitting: Radiation Oncology

## 2015-08-28 ENCOUNTER — Ambulatory Visit
Admission: RE | Admit: 2015-08-28 | Discharge: 2015-08-28 | Disposition: A | Discharge status: Home / Self Care | Payer: Medicare Other | Source: Ambulatory Visit | Attending: Radiation Oncology | Admitting: Radiation Oncology

## 2015-08-28 DIAGNOSIS — Z510 Encounter for antineoplastic radiation therapy: Secondary | ICD-10-CM | POA: Diagnosis not present

## 2015-09-06 NOTE — Progress Notes (Signed)
  Radiation Oncology         [phone] ________________________________  Name: Dylan Griffith MRN: [id number]  Date: 08/28/2015  DOB: [birth year]  End of Treatment Note  DIAGNOSIS: 65 y.o. gentleman with stage T1c adenocarcinoma of the prostate with a Gleason's score of 3+4 and a PSA of 13.24  Indication for treatment:  Curative, Definitive Radiotherapy       Radiation treatment dates:   07/03/2015-08/28/2015  Site/dose:   The prostate was treated to 78 Gy in 40 fractions of 1.95 Gy  Beams/energy:   The patient was treated with IMRT using volumetric arc therapy delivering 6 MV X-rays to clockwise and counterclockwise circumferential arcs with a 90 degree collimator offset to avoid dose scalloping.  Image guidance was performed with daily cone beam CT prior to each fraction to align to gold markers in the prostate and assure proper bladder and rectal fill volumes.  Immobilization was achieved with BodyFix custom mold.  Narrative: The patient tolerated radiation treatment relatively well.   The patient experienced some minor urinary irritation and modest fatigue.    Plan: The patient has completed radiation treatment. He will return to radiation oncology clinic for routine followup in one month. I advised him to call or return sooner if he has any questions or concerns related to his recovery or treatment. ________________________________  Sheral Apley. Tammi Klippel, M.D.

## 2015-09-18 ENCOUNTER — Telehealth: Payer: Self-pay | Admitting: *Deleted

## 2015-09-18 NOTE — Telephone Encounter (Signed)
CALLED PATIENT TO INFORM THAT FU APPT. HAS BEEN MOVED PER PA INSTRUCTION TO 2 PM ON 09-25-15, LVM FOR A RETURN CALL

## 2015-09-25 ENCOUNTER — Encounter: Payer: Self-pay | Admitting: Medical Oncology

## 2015-09-25 ENCOUNTER — Encounter: Payer: Self-pay | Admitting: Radiation Oncology

## 2015-09-25 ENCOUNTER — Ambulatory Visit
Admission: RE | Admit: 2015-09-25 | Discharge: 2015-09-25 | Disposition: A | Discharge status: Home / Self Care | Payer: Medicare Other | Source: Ambulatory Visit | Attending: Radiation Oncology | Admitting: Radiation Oncology

## 2015-09-25 VITALS — BP 115/68 | HR 69 | Temp 98.3°F | Ht 70.0 in | Wt 163.4 lb

## 2015-09-25 DIAGNOSIS — C61 Malignant neoplasm of prostate: Secondary | ICD-10-CM | POA: Insufficient documentation

## 2015-09-25 DIAGNOSIS — R300 Dysuria: Secondary | ICD-10-CM | POA: Insufficient documentation

## 2015-09-25 DIAGNOSIS — N529 Male erectile dysfunction, unspecified: Secondary | ICD-10-CM | POA: Insufficient documentation

## 2015-09-25 NOTE — Progress Notes (Addendum)
Dylan Griffith is S/P XRT for prostate cancer which completed on 08/28/15 reports that he is voiding with complete emptying, decreased urge to void, mild burning at start of stream, nocturia 1-2, and occassionally has dribbling at the start of his urinary stream.  Denies fatigue.  Last PSA Feb 14th, 2017- 13.03.  Next urology Appt. Is on Oct. 25, 2017

## 2015-09-25 NOTE — Progress Notes (Signed)
Oncology Nurse Navigator Documentation  Oncology Nurse Navigator Flowsheets 07/25/2015 08/12/2015 09/25/2015  Navigator Location - - -  Navigator Encounter Type Treatment Treatment Follow-up Appt  Telephone - - -  Confirmed Diagnosis Date - - -  Treatment Initiated Date - - -  Patient Visit Type RadOnc;Follow-up RadOnc RadOnc;Follow-up  Treatment Phase Treatment Treatment Post-Tx Follow-up  Barriers/Navigation Needs No barriers at this time No barriers at this time Education  Education - - Pain/ Symptom Management;Other- Mr. Dunman states that he is about 75% normal. Most of his symptoms post tx have and continue to improve. We discussed that these will continue to improve the further he is out from tx.  Interventions - - Coordination of Care;Education Method- He had several questions regarding follow up  PSA testing and why it take 2-3 years to know if the radiation was effective. Ebony Hail and I addressed the questions and he voiced understanding. He has concerns because his follow up with Dr. Alinda Money is not until Oct. I will reach out to Dr. Alinda Money to see if he would like to move this appointment to an earlier date. I will contact the patient with this information.  Coordination of Care - - Appts  Education Method - - Teach-back;Verbal  Support Groups/Services - - Prostate Support Group;Friends and Family  Acuity - - -  Acuity Level 1 - - -  Acuity Level 2 - - -  Acuity Level 3 - - -  Time Spent with Patient 30 15 30

## 2015-09-26 ENCOUNTER — Telehealth: Payer: Self-pay | Admitting: *Deleted

## 2015-09-26 ENCOUNTER — Other Ambulatory Visit: Payer: Self-pay | Admitting: Radiation Oncology

## 2015-09-26 ENCOUNTER — Telehealth: Payer: Self-pay | Admitting: Medical Oncology

## 2015-09-26 DIAGNOSIS — C61 Malignant neoplasm of prostate: Secondary | ICD-10-CM

## 2015-09-26 NOTE — Telephone Encounter (Signed)
Oncology Nurse Navigator Documentation  Oncology Nurse Navigator Flowsheets 09/25/2015 09/25/2015 09/26/2015  Navigator Encounter Type Follow-up Appt - Telephone  Telephone - - Outgoing Call  Treatment Initiated Date - - -  Patient Visit Type RadOnc;Follow-up - -  Treatment Phase Post-Tx Follow-up - -  Barriers/Navigation Needs Education - -  Education Pain/ Symptom Management;Other - -  Interventions Coordination of Care;Education Method - Coordination of Care- I called Dylan Griffith to inform him that Dr. Alinda Money would like for him to keep his Oct. Appointment as scheduled. Dr. Alinda Money said this will give his PSA time to begin decreasing. I asked him to call be back with any questions or concerns.  Coordination of Care Appts Survivorship Appts  Education Method Teach-back;Verbal - -  Support Groups/Services Prostate Support Group;Friends and Family - -  Acuity - - -  Acuity Level 1 - - -  Acuity Level 2 - - -  Acuity Level 3 - - -  Time Spent with Patient 30 - 15

## 2015-09-26 NOTE — Progress Notes (Signed)
  Radiation Oncology         [phone]) [phone] ________________________________  Name: Dylan Griffith MRN: [id number]  Date: 09/25/2015  DOB: 09-22-1950  Follow-Up Visit Note  CC: Lujean Amel, MD  Raynelle Bring, MD  Diagnosis: Stage T1c adenocarcinoma of the prostate with a Gleason's score of 3+4 and a PSA of 13.24.    ICD-9-CM ICD-10-CM   1. Prostate cancer ([facility]) 185 C61     Interval Since Last Radiation: 4 weeks    07/03/2015-08/28/2015: The prostate was treated to 78 Gy in 40 fractions of 1.95 Gy  Narrative:  The patient returns today for routine follow-up. During treatment the patient did notice dysuria and nocturia 1-2x, which was slightly more noticeable that his baseline. He reports that he is 75% better in terms of his current symptoms, but occasionally notices a weak stream. He denies any frank pain, hematuria, nausea, or diarrhea. He denies chest pain or shortness of breath. He is interested in samples of viagra/cialis from urology and states although he is still having erections, he's not confident about maintaining this for penetration. No other complaints are noted.                          ALLERGIES:  has No Known Allergies.  Meds: Current Outpatient Prescriptions  Medication Sig Dispense Refill  . amLODipine-benazepril (LOTREL) 10-20 MG capsule Take 1 capsule by mouth daily.    . Ascorbic Acid (VITAMIN C) 1000 MG tablet Take 1,000 mg by mouth daily.    Marland Kitchen VITAMIN D, CHOLECALCIFEROL, PO Take by mouth.     No current facility-administered medications for this encounter.    Physical Findings:  height is 5\' 10"  (1.778 m) and weight is 163 lb 6.4 oz (74.118 kg). His temperature is 98.3 F (36.8 C). His blood pressure is 115/68 and his pulse is 69.   In general this is a well appearing African American male in no acute distress. He's alert and oriented x4 and appropriate throughout the examination. Cardiopulmonary assessment is negative for acute distress and he exhibits normal  effort.    Lab Findings: No results found for: WBC, HGB, HCT, PLT  Lab Results  Component Value Date   CREATININE 0.80 05/27/2015    Radiographic Findings: No results found.  Impression/Plan: 1. Stage T1c adenocarcinoma of the prostate with a Gleason's score of 3+4 and a PSA of 13.24. The patient appears to be doing well in recovering from the effects of radiotherapy. We discussed the long term effects he may experience as well. He will follow up for a PSA in the next 2-3 months with Dr. Alinda Money and keep [place] informed if he feels he needs to be evaluated by [place] given his history. 2. Survivorship. The patient is counseled on the role for an evaluation with survivorship clinic. He is interested in a referral and we will coordinate this for him. 3. Erectile dysfunction. I let the patient know I was happy to provide a prescription for cialis/viagra, but he is interested in samples. I encouraged him to reach out to Dr. Alinda Money to see if they have access to samples in the office.    Carola Rhine, PAC

## 2015-09-26 NOTE — Telephone Encounter (Signed)
CALLED PATIENT TO INFORM OF SURVIVORSHIP APPT. FOR 10-24-15 @ 1:30 PM WITH GRETCHEN DAWSON, SPOKE WITH PATIENT AND HE IS AWARE OF THIS APPT.

## 2015-09-26 NOTE — Addendum Note (Signed)
Encounter addended by: Benn Moulder, RN on: 09/26/2015  9:36 AM<BR>     Documentation filed: Charges VN

## 2015-10-24 ENCOUNTER — Encounter: Payer: Medicare Other | Admitting: Adult Health

## 2015-11-22 ENCOUNTER — Telehealth: Payer: Self-pay | Admitting: Medical Oncology

## 2015-11-22 NOTE — Telephone Encounter (Signed)
Oncology Nurse Navigator Documentation  Oncology Nurse Navigator Flowsheets 09/25/2015 09/26/2015 11/22/2015  Navigator Location - - -  Navigator Encounter Type - Telephone Telephone;3 month- Mr. Florkowski states he is doing well post radiation. He states that once in a while he will have a sudden urge to void and he has to act immediately. He continues to have nocturia 1-2 times at night but he was doing this before radiation. No new complaints and I informed him that our Prostate support group will meet in September and I hope he will be able to attend. He thanked me for the call and plans to come.  Telephone - Outgoing Call Outgoing Call  Confirmed Diagnosis Date - - -  Treatment Initiated Date - - -  Patient Visit Type - - -  Treatment Phase - - -  Barriers/Navigation Needs - - No barriers at this time  Education - - -  Interventions - Coordination of Care None required  Coordination of Care Survivorship Appts -  Education Method - - -  Support Groups/Services - - Prostate Support Group  Acuity - - -  Acuity Level 1 - - -  Acuity Level 2 - - -  Acuity Level 3 - - -  Time Spent with Patient - 15 15

## 2016-07-09 ENCOUNTER — Telehealth: Payer: Self-pay | Admitting: Acute Care

## 2016-07-10 NOTE — Telephone Encounter (Signed)
Will forward to lung nodule pool.  

## 2016-07-13 NOTE — Telephone Encounter (Signed)
LMTC x 1  

## 2016-07-27 NOTE — Telephone Encounter (Signed)
LMTC x 1  

## 2016-08-03 NOTE — Telephone Encounter (Signed)
LMOM for pt - Per previous conversation with pt he does not meet the pack history requirement for the lung screening program.  Letter sent to Dr Dorthy Cooler to make him aware.  Referral has been cancelled.  Nothing further needed.

## 2017-04-08 IMAGING — MR MR PROSTATE WO/W CM
23 of 53 series · 23 of 53 positions shown · IV contrast (yes)
Comparison: None.

CLINICAL DATA: Biopsy-proven prostate cancer, Gleason score 6

EXAM:
MR PROSTATE WITHOUT AND WITH CONTRAST
TECHNIQUE: Multiplanar multisequence MRI images were obtained of the pelvis
centered about the prostate. Pre and post contrast images were
obtained.
CONTRAST:  16mL MULTIHANCE GADOBENATE DIMEGLUMINE 529 MG/ML IV SOLN

[Series 3: bSSFP fat-sat · axial · 6.0mm · 0.86mm/px · 1 of 44 slices shown]
[im 1/44]
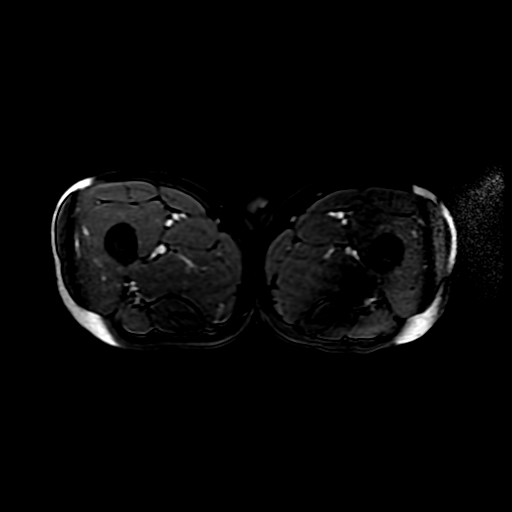

[Series 4: T1 · axial · 6.0mm · 0.86mm/px · 1 of 44 slices shown (1 of 2)]
[im 1/44]
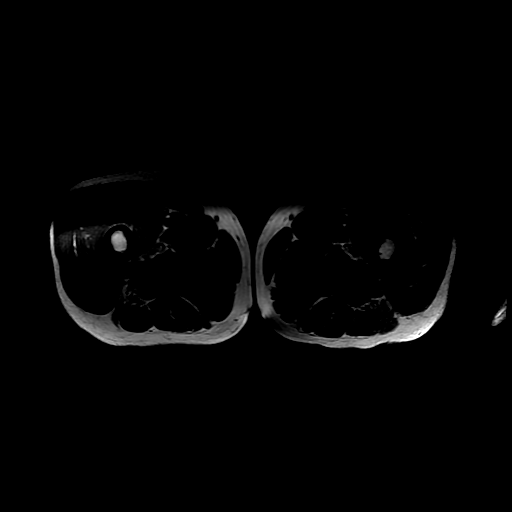

[Series 5: T2 · axial · 3.0mm · 0.29mm/px · 1 of 26 slices shown (1 of 3)]
[im 1/26]
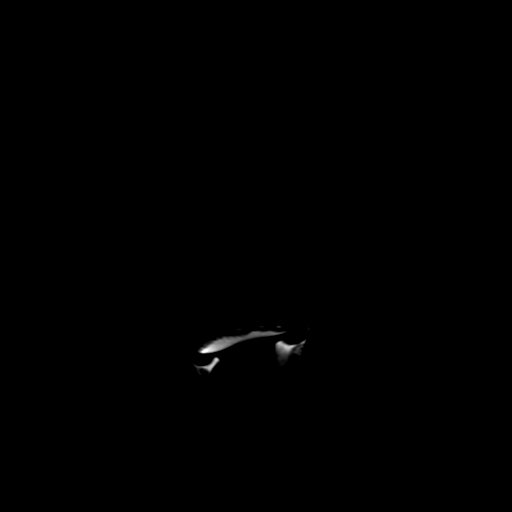

[Series 6: T1 · axial · 3.0mm · 0.29mm/px · 1 of 26 slices shown (2 of 2)]
[im 1/26]
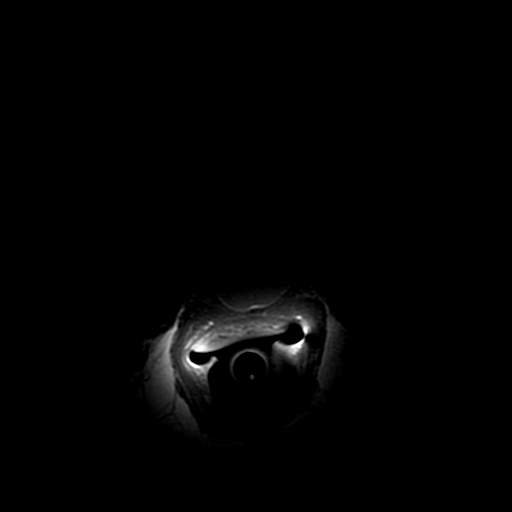

[Series 7: T2 · sagittal · 4.0mm · 0.29mm/px · 1 of 24 slices shown (2 of 3)]
[im 1/24]
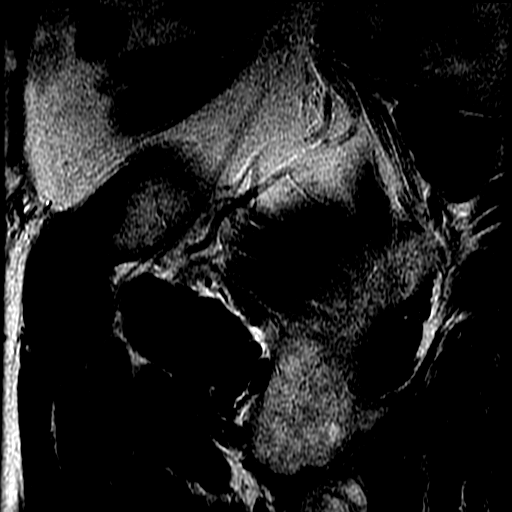

[Series 8: T2 · coronal · 4.0mm · 0.29mm/px · 1 of 19 slices shown (3 of 3)]
[im 1/19]
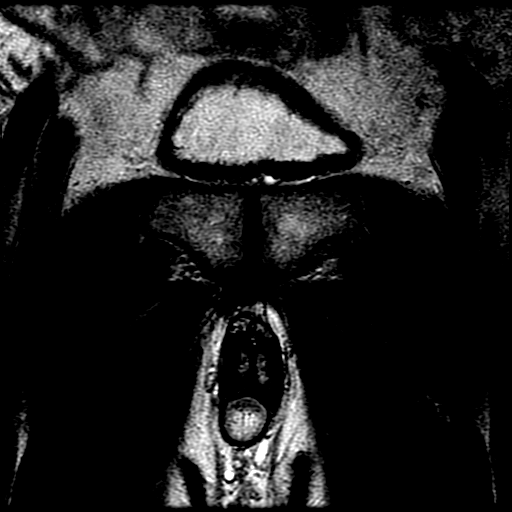

[Series 9: DWI · axial · 3.0mm · 0.59mm/px · 1 of 48 slices shown (1 of 6)]
[im 1/48]
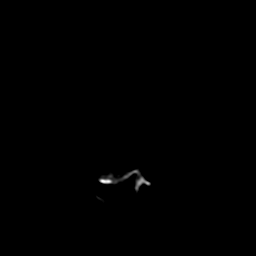

[Series 10: DWI · axial · 3.0mm · 0.59mm/px · 1 of 48 slices shown (2 of 6)]
[im 1/48]
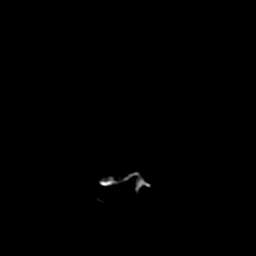

[Series 11: DWI · axial · 3.0mm · 0.59mm/px · 1 of 48 slices shown (3 of 6)]
[im 1/48]
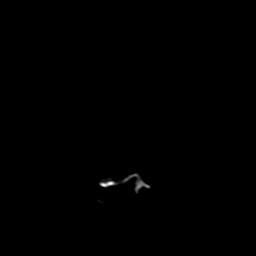

[Series 900: DWI · axial · 3.0mm · 0.59mm/px · 1 of 24 slices shown (4 of 6)]
[im 1/24]
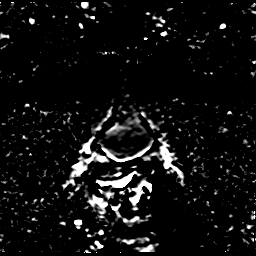

[Series 1000: DWI · axial · 3.0mm · 0.59mm/px · 1 of 24 slices shown (5 of 6)]
[im 1/24]
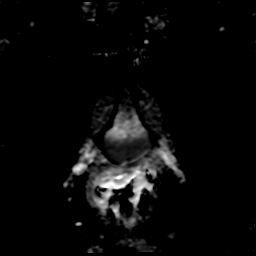

[Series 1100: DWI · axial · 3.0mm · 0.59mm/px · 1 of 24 slices shown (6 of 6)]
[im 1/24]
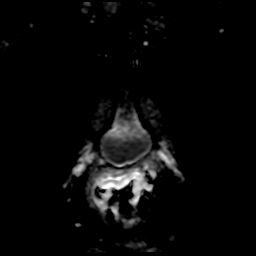

[((id)/(id)/1)-((id)/(id)/1) · axial · 3.0mm · 0.43mm/px · 1 of 75 slices shown (1 of 11)]
[im 1/75]
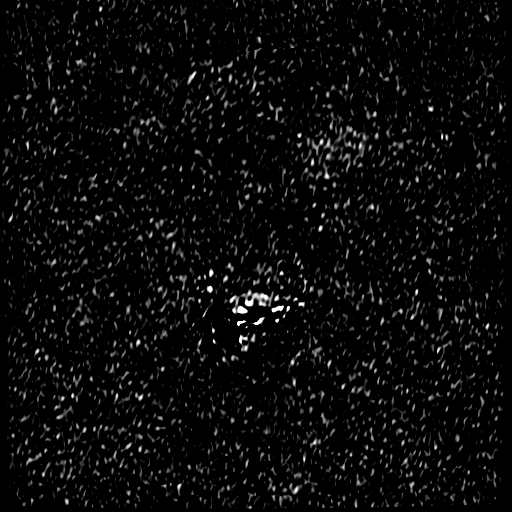

[((id)/(id)/1)-((id)/(id)/1) · axial · 3.0mm · 0.43mm/px · 1 of 76 slices shown (2 of 11)]
[im 1/76]
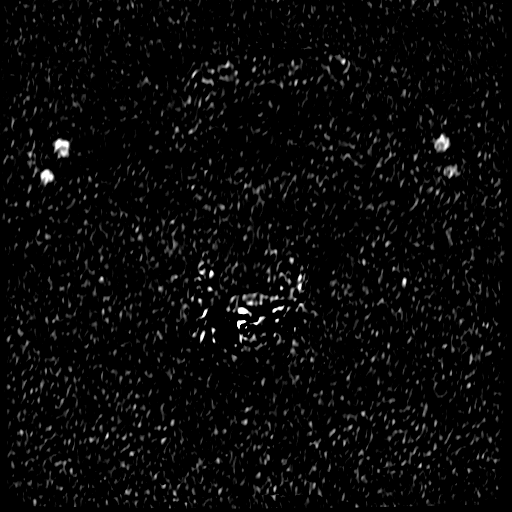

[((id)/(id)/1)-((id)/(id)/1) · axial · 3.0mm · 0.43mm/px · 1 of 76 slices shown (3 of 11)]
[im 1/76]
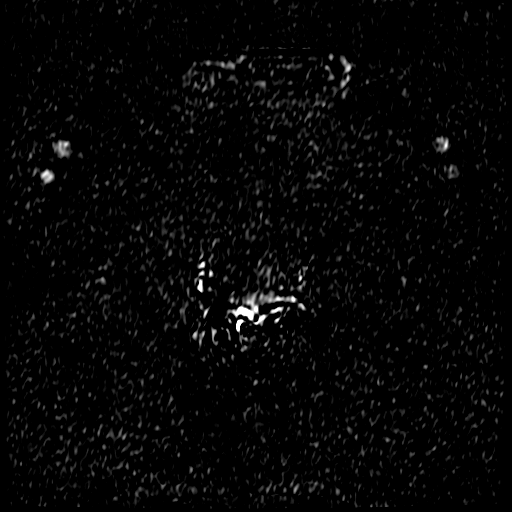

[((id)/(id)/1)-((id)/(id)/1) · axial · 3.0mm · 0.43mm/px · 1 of 76 slices shown (4 of 11)]
[im 1/76]
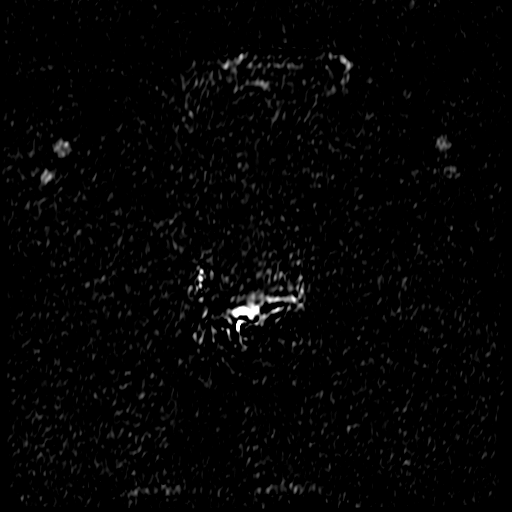

[((id)/(id)/1)-((id)/(id)/1) · axial · 3.0mm · 0.43mm/px · 1 of 76 slices shown (5 of 11)]
[im 1/76]
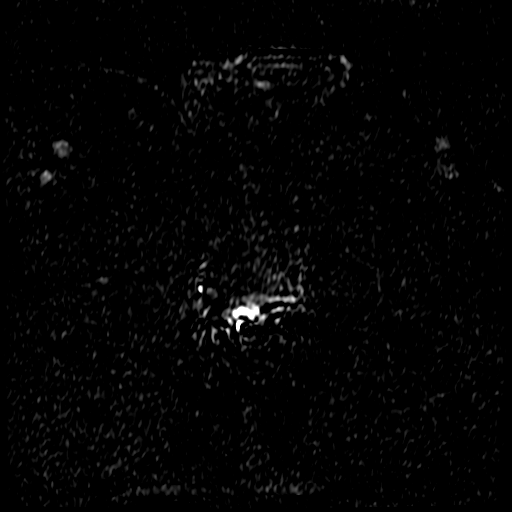

[((id)/(id)/1)-((id)/(id)/1) · axial · 3.0mm · 0.43mm/px · 1 of 76 slices shown (6 of 11)]
[im 1/76]
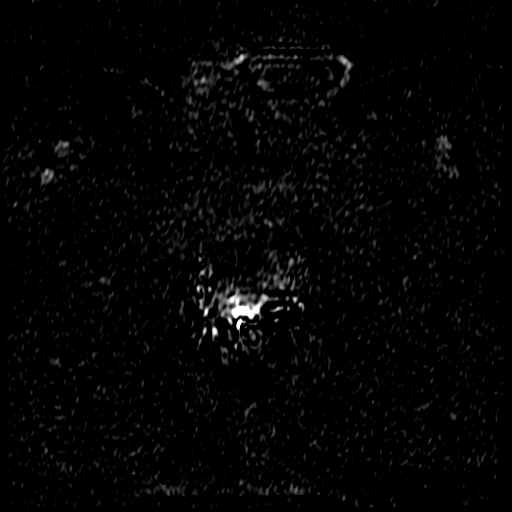

[((id)/(id)/1)-((id)/(id)/1) · axial · 3.0mm · 0.43mm/px · 1 of 76 slices shown (7 of 11)]
[im 1/76]
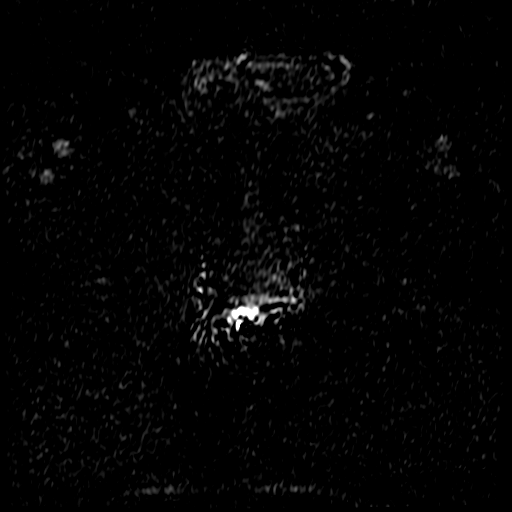

[((id)/(id)/1)-((id)/(id)/1) · axial · 3.0mm · 0.43mm/px · 1 of 76 slices shown (8 of 11)]
[im 1/76]
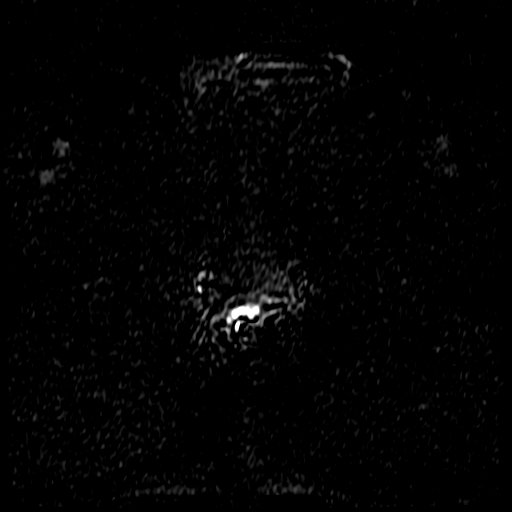

[((id)/(id)/1)-((id)/(id)/1) · axial · 3.0mm · 0.43mm/px · 1 of 76 slices shown (9 of 11)]
[im 1/76]
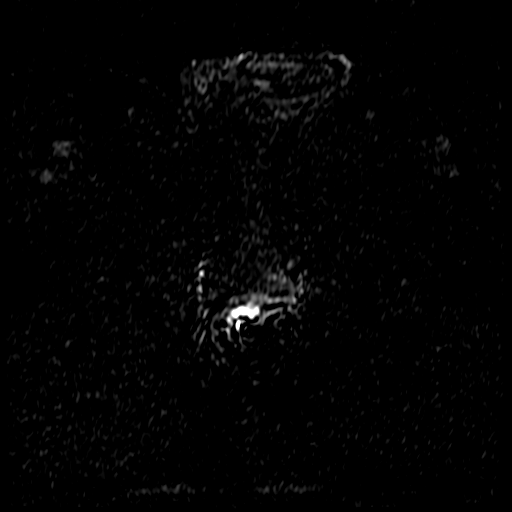

[((id)/(id)/1)-((id)/(id)/1) · axial · 3.0mm · 0.43mm/px · 1 of 76 slices shown (10 of 11)]
[im 1/76]
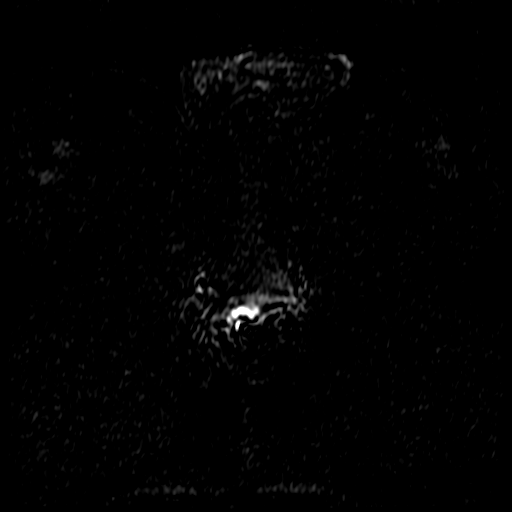

[((id)/(id)/1)-((id)/(id)/1) · axial · 3.0mm · 0.43mm/px · 1 of 76 slices shown (11 of 11)]
[im 1/76]
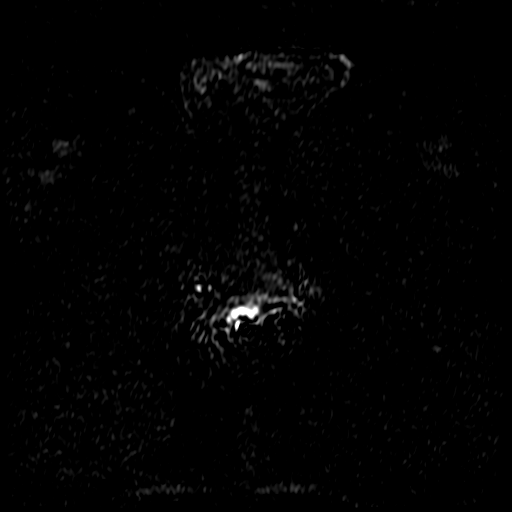

[23 of 53 positions shown; findings below may reference images not displayed]

FINDINGS: Prostate: 11 x 6 mm irregular focus of low T2 signal along the left
posterolateral apex (series 5/image 19), with associated restricted
diffusion/low ADC medially (series 100/image 9), worrisome for
possible high-grade macroscopic prostate cancer.

Additional 7 x 5 mm focus of low T2 signal in the left
posterolateral mid gland (series 5/image 16) and additional 5 mm
focus of vague low T2 signal in the left posterolateral mid gland to
base (series 5/image 13), without associated restricted diffusion,
likely corresponding to known low-grade macroscopic prostate cancer.

No findings specific for high-grade macroscopic prostate cancer in
the right peripheral gland.

Scattered mild central gland nodularity without suspicious nodules
on T2.

Transcapsular spread:  Absent.

Seminal vesicle involvement: Absent.

Neurovascular bundle involvement: Absent.

Pelvic adenopathy: Absent.

Bone metastasis: Absent.

Other findings: None.
IMPRESSION: 11 x 6 mm lesion along the left posterolateral apex of the
peripheral gland, worrisome for possible high-grade macroscopic
prostate cancer.

Two additional foci of suspected low grade macroscopic prostate
cancer within the left mid gland and mid gland apex.

No findings specific for high-grade macroscopic prostate cancer in
the right peripheral gland.

No evidence of extracapsular extension, seminal vesicle invasion, or
metastatic disease.

## 2019-01-31 DIAGNOSIS — Z860100 Personal history of colon polyps, unspecified: Secondary | ICD-10-CM

## 2019-01-31 HISTORY — DX: Personal history of colon polyps, unspecified: Z86.0100

## 2019-04-30 ENCOUNTER — Ambulatory Visit: Payer: Medicare Other

## 2019-05-05 ENCOUNTER — Ambulatory Visit: Payer: Medicare PPO | Attending: Internal Medicine

## 2019-05-05 ENCOUNTER — Ambulatory Visit: Payer: Medicare Other

## 2019-05-05 DIAGNOSIS — Z23 Encounter for immunization: Secondary | ICD-10-CM | POA: Insufficient documentation

## 2019-05-05 NOTE — Progress Notes (Signed)
   Covid-19 Vaccination Clinic  Name:  Dylan Griffith    MRN: [id number] DOB: 21-Oct-1950  05/05/2019  [name] was observed post Covid-19 immunization for 15 minutes without incidence. He was provided with Vaccine Information Sheet and instruction to access the V-Safe system.   [name] was instructed to call 911 with any severe reactions post vaccine: Marland Kitchen Difficulty breathing  . Swelling of your face and throat  . A fast heartbeat  . A bad rash all over your body  . Dizziness and weakness    Immunizations Administered    Name Date Dose VIS Date Route   Pfizer COVID-19 Vaccine 05/05/2019  3:24 PM 0.3 mL 03/10/2019 Intramuscular   Manufacturer: [place]   Lot: [id number]   [place]: [id number]

## 2019-05-30 ENCOUNTER — Ambulatory Visit: Payer: Medicare PPO | Attending: Internal Medicine

## 2019-05-30 DIAGNOSIS — Z23 Encounter for immunization: Secondary | ICD-10-CM | POA: Insufficient documentation

## 2019-05-30 NOTE — Progress Notes (Signed)
   Covid-19 Vaccination Clinic  Name:  Dylan Griffith    MRN: [id number] DOB: [birth year]  05/30/2019  [name] was observed post Covid-19 immunization for 15 minutes without incident. He was provided with Vaccine Information Sheet and instruction to access the V-Safe system.   [name] was instructed to call 911 with any severe reactions post vaccine: Marland Kitchen Difficulty breathing  . Swelling of face and throat  . A fast heartbeat  . A bad rash all over body  . Dizziness and weakness   Immunizations Administered    Name Date Dose VIS Date Route   Pfizer COVID-19 Vaccine 05/30/2019  3:19 PM 0.3 mL 03/10/2019 Intramuscular   Manufacturer: [place]   Lot: [id number]   [place]: [id number]

## 2019-08-29 DIAGNOSIS — E785 Hyperlipidemia, unspecified: Secondary | ICD-10-CM | POA: Diagnosis not present

## 2019-10-19 DIAGNOSIS — C61 Malignant neoplasm of prostate: Secondary | ICD-10-CM | POA: Diagnosis not present

## 2019-11-03 DIAGNOSIS — N5201 Erectile dysfunction due to arterial insufficiency: Secondary | ICD-10-CM | POA: Diagnosis not present

## 2019-11-03 DIAGNOSIS — C61 Malignant neoplasm of prostate: Secondary | ICD-10-CM | POA: Diagnosis not present

## 2020-05-21 DIAGNOSIS — C61 Malignant neoplasm of prostate: Secondary | ICD-10-CM | POA: Diagnosis not present

## 2020-05-28 DIAGNOSIS — R3121 Asymptomatic microscopic hematuria: Secondary | ICD-10-CM | POA: Diagnosis not present

## 2020-05-28 DIAGNOSIS — C61 Malignant neoplasm of prostate: Secondary | ICD-10-CM | POA: Diagnosis not present

## 2020-05-28 DIAGNOSIS — N5201 Erectile dysfunction due to arterial insufficiency: Secondary | ICD-10-CM | POA: Diagnosis not present

## 2020-07-30 DIAGNOSIS — Z79899 Other long term (current) drug therapy: Secondary | ICD-10-CM | POA: Diagnosis not present

## 2020-07-30 DIAGNOSIS — I10 Essential (primary) hypertension: Secondary | ICD-10-CM | POA: Diagnosis not present

## 2020-07-30 DIAGNOSIS — N529 Male erectile dysfunction, unspecified: Secondary | ICD-10-CM | POA: Diagnosis not present

## 2020-07-30 DIAGNOSIS — Z0001 Encounter for general adult medical examination with abnormal findings: Secondary | ICD-10-CM | POA: Diagnosis not present

## 2020-07-30 DIAGNOSIS — E7800 Pure hypercholesterolemia, unspecified: Secondary | ICD-10-CM | POA: Diagnosis not present

## 2020-07-30 DIAGNOSIS — R7303 Prediabetes: Secondary | ICD-10-CM | POA: Diagnosis not present

## 2020-07-30 DIAGNOSIS — C61 Malignant neoplasm of prostate: Secondary | ICD-10-CM | POA: Diagnosis not present

## 2021-02-04 DIAGNOSIS — C61 Malignant neoplasm of prostate: Secondary | ICD-10-CM | POA: Diagnosis not present

## 2021-02-11 DIAGNOSIS — C61 Malignant neoplasm of prostate: Secondary | ICD-10-CM | POA: Diagnosis not present

## 2021-02-11 DIAGNOSIS — N5201 Erectile dysfunction due to arterial insufficiency: Secondary | ICD-10-CM | POA: Diagnosis not present

## 2021-08-21 DIAGNOSIS — I10 Essential (primary) hypertension: Secondary | ICD-10-CM | POA: Diagnosis not present

## 2021-08-21 DIAGNOSIS — F17200 Nicotine dependence, unspecified, uncomplicated: Secondary | ICD-10-CM | POA: Diagnosis not present

## 2021-08-21 DIAGNOSIS — E7800 Pure hypercholesterolemia, unspecified: Secondary | ICD-10-CM | POA: Diagnosis not present

## 2021-08-21 DIAGNOSIS — Z0000 Encounter for general adult medical examination without abnormal findings: Secondary | ICD-10-CM | POA: Diagnosis not present

## 2021-08-21 DIAGNOSIS — Z79899 Other long term (current) drug therapy: Secondary | ICD-10-CM | POA: Diagnosis not present

## 2021-08-21 DIAGNOSIS — R7303 Prediabetes: Secondary | ICD-10-CM | POA: Diagnosis not present

## 2021-08-21 DIAGNOSIS — C61 Malignant neoplasm of prostate: Secondary | ICD-10-CM | POA: Diagnosis not present

## 2022-03-03 DIAGNOSIS — C61 Malignant neoplasm of prostate: Secondary | ICD-10-CM | POA: Diagnosis not present

## 2022-03-10 DIAGNOSIS — C61 Malignant neoplasm of prostate: Secondary | ICD-10-CM | POA: Diagnosis not present

## 2022-03-10 DIAGNOSIS — N5201 Erectile dysfunction due to arterial insufficiency: Secondary | ICD-10-CM | POA: Diagnosis not present

## 2022-03-10 DIAGNOSIS — R3121 Asymptomatic microscopic hematuria: Secondary | ICD-10-CM | POA: Diagnosis not present

## 2022-05-19 DIAGNOSIS — N281 Cyst of kidney, acquired: Secondary | ICD-10-CM | POA: Diagnosis not present

## 2022-05-19 DIAGNOSIS — N3289 Other specified disorders of bladder: Secondary | ICD-10-CM | POA: Diagnosis not present

## 2022-05-19 DIAGNOSIS — R3121 Asymptomatic microscopic hematuria: Secondary | ICD-10-CM | POA: Diagnosis not present

## 2022-05-19 DIAGNOSIS — R319 Hematuria, unspecified: Secondary | ICD-10-CM | POA: Diagnosis not present

## 2022-05-21 DIAGNOSIS — R3121 Asymptomatic microscopic hematuria: Secondary | ICD-10-CM | POA: Diagnosis not present

## 2022-09-15 DIAGNOSIS — I480 Paroxysmal atrial fibrillation: Secondary | ICD-10-CM

## 2022-09-15 DIAGNOSIS — I499 Cardiac arrhythmia, unspecified: Secondary | ICD-10-CM | POA: Diagnosis not present

## 2022-09-15 DIAGNOSIS — D6869 Other thrombophilia: Secondary | ICD-10-CM | POA: Diagnosis not present

## 2022-09-15 DIAGNOSIS — I10 Essential (primary) hypertension: Secondary | ICD-10-CM | POA: Diagnosis not present

## 2022-09-15 DIAGNOSIS — Z0001 Encounter for general adult medical examination with abnormal findings: Secondary | ICD-10-CM | POA: Diagnosis not present

## 2022-09-15 DIAGNOSIS — I700 Atherosclerosis of aorta: Secondary | ICD-10-CM | POA: Diagnosis not present

## 2022-09-15 DIAGNOSIS — E7800 Pure hypercholesterolemia, unspecified: Secondary | ICD-10-CM | POA: Diagnosis not present

## 2022-09-15 DIAGNOSIS — R7303 Prediabetes: Secondary | ICD-10-CM | POA: Diagnosis not present

## 2022-09-15 DIAGNOSIS — Z79899 Other long term (current) drug therapy: Secondary | ICD-10-CM | POA: Diagnosis not present

## 2022-09-15 HISTORY — DX: Paroxysmal atrial fibrillation: I48.0

## 2022-09-24 DIAGNOSIS — I480 Paroxysmal atrial fibrillation: Secondary | ICD-10-CM | POA: Diagnosis not present

## 2022-11-12 ENCOUNTER — Ambulatory Visit: Payer: Medicare PPO | Admitting: Cardiology

## 2023-01-04 NOTE — Progress Notes (Unsigned)
Cardiology Office Note:    Date:  01/05/2023   ID:  Dylan Griffith, DOB 10/22/1950, MRN [id number]  PCP:  Darrow Bussing, MD  Cardiologist:  None  Electrophysiologist:  None   Referring MD: Darrow Bussing, MD   Chief Complaint  Patient presents with   Atrial Fibrillation    History of Present Illness:    Dylan Griffith is a 72 y.o. male with a hx of paroxysmal atrial fibrillation, prostate cancer, hypertension who is referred by Dr. Docia Chuck for evaluation of atrial fibrillation.  Noted to be in A-fib at appointment with Dr. Docia Chuck on 08/2022.  Denies any chest pain, dyspnea, lightheadedness, syncope, lower extremity edema, or palpitations.  Reports he walks 2 miles 3 days/week, denies any exertional symptoms.  Denies any bleeding on Eliquis but does report he sometimes forgets evening dose.  He has smoked since age 25, currently smoking 5 cigarettes/day but down from peak 15 cigarettes/day.  Family history includes brother has ICD but he is unsure of details.    Past Medical History:  Diagnosis Date   History of colon polyps 01/31/2019   Hypertension    Paroxysmal atrial fibrillation (HCC) 09/15/2022   Prostate cancer (HCC)     Past Surgical History:  Procedure Laterality Date   MOUTH SURGERY     "surgical tooth extraction"   PROSTATE BIOPSY     PROSTATE BIOPSY     TONSILLECTOMY      Current Medications: Current Meds  Medication Sig   amLODipine-benazepril (LOTREL) 10-20 MG capsule Take 1 capsule by mouth daily.   apixaban (ELIQUIS) 5 MG TABS tablet Take 5 mg by mouth 2 (two) times daily.   Ascorbic Acid (VITAMIN C) 1000 MG tablet Take 1,000 mg by mouth daily.   pravastatin (PRAVACHOL) 20 MG tablet Take 20 mg by mouth daily.   VITAMIN D, CHOLECALCIFEROL, PO Take by mouth.     Allergies:   Patient has no known allergies.   Social History   Socioeconomic History   Marital status: Married    Spouse name: Not on file   Number of children: Not on file   Years of  education: Not on file   Highest education level: Not on file  Occupational History   Not on file  Tobacco Use   Smoking status: Every Day    Current packs/day: 0.25    Average packs/day: 0.3 packs/day for 30.0 years (7.5 ttl pk-yrs)    Types: Cigarettes   Smokeless tobacco: Never  Substance and Sexual Activity   Alcohol use: Yes    Comment: a few per week.   Drug use: Yes    Comment: 2 drinks per week   Sexual activity: Yes  Other Topics Concern   Not on file  Social History Narrative   Not on file   Social Determinants of Health   Financial Resource Strain: Not on file  Food Insecurity: Not on file  Transportation Needs: Not on file  Physical Activity: Not on file  Stress: Not on file  Social Connections: Not on file     Family History: The patient's family history is negative for Cancer.  ROS:   Please see the history of present illness.     All other systems reviewed and are negative.  EKGs/Labs/Other Studies Reviewed:    The following studies were reviewed today:   EKG:   01/05/2023: Normal sinus rhythm, rate 70, no ST abnormalities  Recent Labs: No results found for requested labs within last 365 days.  Recent Lipid Panel No results found for: "CHOL", "TRIG", "HDL", "CHOLHDL", "VLDL", "LDLCALC", "LDLDIRECT"  Physical Exam:    VS:  BP 136/78   Pulse 70   Ht 5\' 10"  (1.778 m)   Wt 164 lb (74.4 kg)   SpO2 96%   BMI 23.53 kg/m     Wt Readings from Last 3 Encounters:  01/05/23 164 lb (74.4 kg)  09/25/15 163 lb 6.4 oz (74.1 kg)  08/22/15 165 lb 14.4 oz (75.3 kg)     GEN:  Well nourished, well developed in no acute distress HEENT: Normal NECK: No JVD; No carotid bruits LYMPHATICS: No lymphadenopathy CARDIAC: RRR, no murmurs, rubs, gallops RESPIRATORY:  Clear to auscultation without rales, wheezing or rhonchi  ABDOMEN: Soft, non-tender, non-distended MUSCULOSKELETAL:  No edema; No deformity  SKIN: Warm and dry NEUROLOGIC:  Alert and oriented x  3 PSYCHIATRIC:  Normal affect   ASSESSMENT:    1. Paroxysmal atrial fibrillation (HCC)   2. Essential hypertension   3. Hyperlipidemia, unspecified hyperlipidemia type   4. Tobacco use    PLAN:    Paroxysmal atrial fibrillation: Noted to be in A-fib at appointment with Dr. Docia Chuck on 08/2022.  Rate controlled despite no AV nodal blockers.  CHA2DS2-VASc 2 (hypertension, age) -Has had issues remembering to take evening dose of Eliquis, would prefer once per day dosing of medications.  Will switch to Xarelto 20 mg daily.  Check CMET, CBC -Check Zio patch x 2 weeks to evaluate A-fib burden -Echocardiogram  Hypertension: On amlodipine-benazepril 10-20 mg daily.  Appears controlled  Hyperlipidemia: On pravastatin 20 mg daily.  LDL 82 on 09/15/2022  Tobacco use: Counseled on risk of smoking and cessation strongly recommended.  He is not interested in quitting at this time.  RTC in 4 months   Medication Adjustments/Labs and Tests Ordered: Current medicines are reviewed at length with the patient today.  Concerns regarding medicines are outlined above.  Orders Placed This Encounter  Procedures   Comprehensive Metabolic Panel (CMET)   CBC w/Diff/Platelet   LONG TERM MONITOR (3-14 DAYS)   EKG 12-Lead   ECHOCARDIOGRAM COMPLETE   No orders of the defined types were placed in this encounter.   Patient Instructions  Medication Instructions:   *If you need a refill on your cardiac medications before your next appointment, please call your pharmacy*   Lab Work:  If you have labs (blood work) drawn today and your tests are completely normal, you will receive your results only by: MyChart Message (if you have MyChart) OR A paper copy in the mail If you have any lab test that is abnormal or we need to change your treatment, we will call you to review the results.   Testing/Procedures:   Follow-Up: At [facility], you and your health needs are our priority.  As part of our  continuing mission to provide you with exceptional heart care, we have created designated Provider Care Teams.  These Care Teams include your primary Cardiologist (physician) and Advanced Practice Providers (APPs -  Physician Assistants and Nurse Practitioners) who all work together to provide you with the care you need, when you need it.  We recommend signing up for the patient portal called "MyChart".  Sign up information is provided on this After Visit Summary.  MyChart is used to connect with patients for Virtual Visits (Telemedicine).  Patients are able to view lab/test results, encounter notes, upcoming appointments, etc.  Non-urgent messages can be sent to your provider as well.  To learn more about what you can do with MyChart, go to [email or web address].    Your next appointment:      Provider:  Dr. Bjorn Pippin     Other Instructions     Signed, Little Ishikawa, MD  01/05/2023 9:31 AM    [facility]

## 2023-01-05 ENCOUNTER — Ambulatory Visit: Payer: Medicare PPO | Attending: Cardiology

## 2023-01-05 ENCOUNTER — Ambulatory Visit: Payer: Medicare PPO | Attending: Cardiology | Admitting: Cardiology

## 2023-01-05 VITALS — BP 136/78 | HR 70 | Ht 70.0 in | Wt 164.0 lb

## 2023-01-05 DIAGNOSIS — I4891 Unspecified atrial fibrillation: Secondary | ICD-10-CM | POA: Diagnosis not present

## 2023-01-05 DIAGNOSIS — E785 Hyperlipidemia, unspecified: Secondary | ICD-10-CM

## 2023-01-05 DIAGNOSIS — Z720 Tobacco use: Secondary | ICD-10-CM

## 2023-01-05 DIAGNOSIS — I480 Paroxysmal atrial fibrillation: Secondary | ICD-10-CM

## 2023-01-05 DIAGNOSIS — I10 Essential (primary) hypertension: Secondary | ICD-10-CM | POA: Diagnosis not present

## 2023-01-05 DIAGNOSIS — Z136 Encounter for screening for cardiovascular disorders: Secondary | ICD-10-CM | POA: Diagnosis not present

## 2023-01-05 MED ORDER — RIVAROXABAN 20 MG PO TABS: 20.0000 mg | ORAL_TABLET | Freq: Every day | ORAL | 3 refills | Status: DC

## 2023-01-05 NOTE — Patient Instructions (Addendum)
Medication Instructions:  Stop Eliquis.  Start Xarelto 25mg  every day with  supper *If you need a refill on your cardiac medications before your next appointment, please call your pharmacy*   Lab Work: CBC. CMET TODAY  If you have labs (blood work) drawn today and your tests are completely normal, you will receive your results only by: MyChart Message (if you have MyChart) OR A paper copy in the mail If you have any lab test that is abnormal or we need to change your treatment, we will call you to review the results.   Testing/Procedures: ECHO , PLEASE SCHEDULE AT CHECK OUT   Zio Your physician has recommended that you wear a holter monitor. Holter monitors are medical devices that record the heart's electrical activity. Doctors most often use these monitors to diagnose arrhythmias. Arrhythmias are problems with the speed or rhythm of the heartbeat. The monitor is a small, portable device. You can wear one while you do your normal daily activities. This is usually used to diagnose what is causing palpitations/syncope (passing out).   ZIO XT- Long Term Monitor Instructions  Your physician has requested you wear a ZIO patch monitor for 14 days.  This is a single patch monitor. [employer] supplies one patch monitor per enrollment. Additional stickers are not available. Please do not apply patch if you will be having a Nuclear Stress Test,  Echocardiogram, Cardiac CT, MRI, or Chest Xray during the period you would be wearing the  monitor. The patch cannot be worn during these tests. You cannot remove and re-apply the  ZIO XT patch monitor.  Your ZIO patch monitor will be mailed 3 day USPS to your address on file. It may take 3-5 days  to receive your monitor after you have been enrolled.  Once you have received your monitor, please review the enclosed instructions. Your monitor  has already been registered assigning a specific monitor serial # to you.  Billing and Patient Assistance  Program Information  We have supplied [employer] with any of your insurance information on file for billing purposes. [employer] offers a sliding scale Patient Assistance Program for patients that do not have  insurance, or whose insurance does not completely cover the cost of the ZIO monitor.  You must apply for the Patient Assistance Program to qualify for this discounted rate.  To apply, please call [employer] at [phone], select option 4, select option 2, ask to apply for  Patient Assistance Program. Meredeth Ide will ask your household income, and how many people  are in your household. They will quote your out-of-pocket cost based on that information.  [employer] will also be able to set up a 15-month, interest-free payment plan if needed.  Applying the monitor   Shave hair from upper left chest.  Hold abrader disc by orange tab. Rub abrader in 40 strokes over the upper left chest as  indicated in your monitor instructions.  Clean area with 4 enclosed alcohol pads. Let dry.  Apply patch as indicated in monitor instructions. Patch will be placed under collarbone on left  side of chest with arrow pointing upward.  Rub patch adhesive wings for 2 minutes. Remove white label marked "1". Remove the white  label marked "2". Rub patch adhesive wings for 2 additional minutes.  While looking in a mirror, press and release button in center of patch. A small green light will  flash 3-4 times. This will be your only indicator that the monitor has been turned on.  Do not shower  for the first 24 hours. You may shower after the first 24 hours.  Press the button if you feel a symptom. You will hear a small click. Record Date, Time and  Symptom in the Patient Logbook.  When you are ready to remove the patch, follow instructions on the last 2 pages of Patient  Logbook. Stick patch monitor onto the last page of Patient Logbook.  Place Patient Logbook in the blue and white box. Use locking tab on box and tape box  closed  securely. The blue and white box has prepaid postage on it. Please place it in the mailbox as  soon as possible. Your physician should have your test results approximately 7 days after the  monitor has been mailed back to [facility].  Call [facility] Customer Care at [phone] if you have questions regarding  your ZIO XT patch monitor. Call them immediately if you see an orange light blinking on your  monitor.  If your monitor falls off in less than 4 days, contact our Monitor department at [phone].  If your monitor becomes loose or falls off after 4 days call [employer] at [phone] for  suggestions on securing your monitor     Follow-Up: At [facility], you and your health needs are our priority.  As part of our continuing mission to provide you with exceptional heart care, we have created designated Provider Care Teams.  These Care Teams include your primary Cardiologist (physician) and Advanced Practice Providers (APPs -  Physician Assistants and Nurse Practitioners) who all work together to provide you with the care you need, when you need it.  We recommend signing up for the patient portal called "MyChart".  Sign up information is provided on this After Visit Summary.  MyChart is used to connect with patients for Virtual Visits (Telemedicine).  Patients are able to view lab/test results, encounter notes, upcoming appointments, etc.  Non-urgent messages can be sent to your provider as well.   To learn more about what you can do with MyChart, go to [email or web address].    Your next appointment:  PLEASE SCHEDULE YOUR APPT TODAY 4 MONTHS   Provider:  Dr. Bjorn Pippin     Other Instructions:

## 2023-01-05 NOTE — Progress Notes (Unsigned)
Enrolled patient for a 14 day Zio XT  monitor to be mailed to patients home  °

## 2023-01-06 ENCOUNTER — Encounter: Payer: Self-pay | Admitting: *Deleted

## 2023-01-06 LAB — COMPREHENSIVE METABOLIC PANEL
ALT: 18 IU/L (ref 0–44)
AST: 23 IU/L (ref 0–40)
Albumin: 4.2 g/dL (ref 3.8–4.8)
Alkaline Phosphatase: 105 IU/L (ref 44–121)
BUN/Creatinine Ratio: 18 (ref 10–24)
BUN: 15 mg/dL (ref 8–27)
Bilirubin Total: 0.3 mg/dL (ref 0.0–1.2)
CO2: 24 mmol/L (ref 20–29)
Calcium: 9.7 mg/dL (ref 8.6–10.2)
Chloride: 103 mmol/L (ref 96–106)
Creatinine, Ser: 0.84 mg/dL (ref 0.76–1.27)
Globulin, Total: 2.8 g/dL (ref 1.5–4.5)
Glucose: 80 mg/dL (ref 70–99)
Potassium: 4.2 mmol/L (ref 3.5–5.2)
Sodium: 140 mmol/L (ref 134–144)
Total Protein: 7 g/dL (ref 6.0–8.5)
eGFR: 92 mL/min/{1.73_m2} (ref >59)

## 2023-01-06 LAB — CBC WITH DIFFERENTIAL/PLATELET
Basophils Absolute: 0.1 10*3/uL (ref 0.0–0.2)
Basos: 1 %
EOS (ABSOLUTE): 0.1 10*3/uL (ref 0.0–0.4)
Eos: 1 %
Hematocrit: 46.3 % (ref 37.5–51.0)
Hemoglobin: 15.8 g/dL (ref 13.0–17.7)
Immature Grans (Abs): 0.1 10*3/uL (ref 0.0–0.1)
Immature Granulocytes: 1 %
Lymphocytes Absolute: 1 10*3/uL (ref 0.7–3.1)
Lymphs: 10 %
MCH: 31.3 pg (ref 26.6–33.0)
MCHC: 34.1 g/dL (ref 31.5–35.7)
MCV: 92 fL (ref 79–97)
Monocytes Absolute: 0.9 10*3/uL (ref 0.1–0.9)
Monocytes: 9 %
Neutrophils Absolute: 8.3 10*3/uL — ABNORMAL HIGH (ref 1.4–7.0)
Neutrophils: 78 %
Platelets: 261 10*3/uL (ref 150–450)
RBC: 5.04 x10E6/uL (ref 4.14–5.80)
RDW: 12 % (ref 11.6–15.4)
WBC: 10.5 10*3/uL (ref 3.4–10.8)

## 2023-01-08 DIAGNOSIS — I480 Paroxysmal atrial fibrillation: Secondary | ICD-10-CM

## 2023-02-01 DIAGNOSIS — I480 Paroxysmal atrial fibrillation: Secondary | ICD-10-CM | POA: Diagnosis not present

## 2023-02-02 ENCOUNTER — Ambulatory Visit (HOSPITAL_COMMUNITY): Payer: Medicare PPO | Attending: Cardiology

## 2023-02-02 DIAGNOSIS — I480 Paroxysmal atrial fibrillation: Secondary | ICD-10-CM | POA: Diagnosis not present

## 2023-02-02 LAB — ECHOCARDIOGRAM COMPLETE
Area-P 1/2: 3.24 cm2
P 1/2 time: 485 ms
S' Lateral: 3 cm

## 2023-02-10 ENCOUNTER — Telehealth: Payer: Self-pay | Admitting: Cardiology

## 2023-02-10 ENCOUNTER — Other Ambulatory Visit: Payer: Self-pay

## 2023-02-10 MED ORDER — METOPROLOL SUCCINATE ER 25 MG PO TB24: 25.0000 mg | ORAL_TABLET | Freq: Every day | ORAL | 3 refills | Status: DC

## 2023-02-10 NOTE — Telephone Encounter (Signed)
Already spoke to patient see monitor result note.

## 2023-02-10 NOTE — Telephone Encounter (Signed)
Patient stated he is returning RN Cheryl's call.

## 2023-02-10 NOTE — Telephone Encounter (Signed)
Charna Elizabeth, LPN 65/[id number]/4696  8:53 AM EST     Called patient left message on personal voice mail to call back.   Little Ishikawa, MD 02/07/2023 10:55 PM EST     In atrial fibrillation 6% of the time.  Did appear to intermittently have elevated rates in A-fib, recommend starting metoprolol 25 mg twice daily   Left message to call back

## 2023-03-09 DIAGNOSIS — C61 Malignant neoplasm of prostate: Secondary | ICD-10-CM | POA: Diagnosis not present

## 2023-03-16 DIAGNOSIS — R3121 Asymptomatic microscopic hematuria: Secondary | ICD-10-CM | POA: Diagnosis not present

## 2023-03-16 DIAGNOSIS — C61 Malignant neoplasm of prostate: Secondary | ICD-10-CM | POA: Diagnosis not present

## 2023-03-19 DIAGNOSIS — I4891 Unspecified atrial fibrillation: Secondary | ICD-10-CM | POA: Diagnosis not present

## 2023-03-19 DIAGNOSIS — M1990 Unspecified osteoarthritis, unspecified site: Secondary | ICD-10-CM | POA: Diagnosis not present

## 2023-03-19 DIAGNOSIS — D6869 Other thrombophilia: Secondary | ICD-10-CM | POA: Diagnosis not present

## 2023-03-19 DIAGNOSIS — R7303 Prediabetes: Secondary | ICD-10-CM | POA: Diagnosis not present

## 2023-03-19 DIAGNOSIS — I700 Atherosclerosis of aorta: Secondary | ICD-10-CM | POA: Diagnosis not present

## 2023-03-19 DIAGNOSIS — I129 Hypertensive chronic kidney disease with stage 1 through stage 4 chronic kidney disease, or unspecified chronic kidney disease: Secondary | ICD-10-CM | POA: Diagnosis not present

## 2023-03-19 DIAGNOSIS — E785 Hyperlipidemia, unspecified: Secondary | ICD-10-CM | POA: Diagnosis not present

## 2023-03-19 DIAGNOSIS — N181 Chronic kidney disease, stage 1: Secondary | ICD-10-CM | POA: Diagnosis not present

## 2023-03-19 DIAGNOSIS — Z7901 Long term (current) use of anticoagulants: Secondary | ICD-10-CM | POA: Diagnosis not present

## 2023-05-16 NOTE — Progress Notes (Unsigned)
Cardiology Office Note:    Date:  05/18/2023   ID:  KUNAL LEVARIO, DOB [birth year], MRN [id number]  PCP:  Darrow Bussing, MD  Cardiologist:  None  Electrophysiologist:  None   Referring MD: Darrow Bussing, MD   Chief Complaint  Patient presents with   Atrial Fibrillation    History of Present Illness:    BASEM YANNUZZI is a 73 y.o. male with a hx of paroxysmal atrial fibrillation, prostate cancer, hypertension who presents for follow-up.  He was referred by Dr. Docia Chuck for evaluation of atrial fibrillation.  Noted to be in A-fib at appointment with Dr. Docia Chuck on 08/2022.  Zio patch x 13 days 12/2022 showed 6% A-fib burden with average rate 104 bpm and longest episode lasting 8.5 hours, 1 episode of NSVT lasting 5 beats.  Echocardiogram 02/02/2023 showed normal biventricular function, no significant valvular disease.  Since last clinic visit, he reports he is doing well.  Denies any chest pain, dyspnea, lightheadedness, syncope, lower extremity edema, or palpitations.  He is taking Xarelto, denies any bleeding issues.  He continues to smoke 5 cigarettes/day.    Past Medical History:  Diagnosis Date   History of colon polyps 01/31/2019   Hypertension    Paroxysmal atrial fibrillation (HCC) 09/15/2022   Prostate cancer (HCC)     Past Surgical History:  Procedure Laterality Date   MOUTH SURGERY     "surgical tooth extraction"   PROSTATE BIOPSY     PROSTATE BIOPSY     TONSILLECTOMY      Current Medications: Current Meds  Medication Sig   amLODipine-benazepril (LOTREL) 10-20 MG capsule Take 1 capsule by mouth daily.   Ascorbic Acid (VITAMIN C) 1000 MG tablet Take 1,000 mg by mouth daily.   metoprolol succinate (TOPROL XL) 25 MG 24 hr tablet Take 1 tablet (25 mg total) by mouth daily.   pravastatin (PRAVACHOL) 20 MG tablet Take 20 mg by mouth daily.   rivaroxaban (XARELTO) 20 MG TABS tablet Take 1 tablet (20 mg total) by mouth daily with supper.   VITAMIN D, CHOLECALCIFEROL,  PO Take by mouth.     Allergies:   Patient has no known allergies.   Social History   Socioeconomic History   Marital status: Married    Spouse name: Not on file   Number of children: Not on file   Years of education: Not on file   Highest education level: Not on file  Occupational History   Not on file  Tobacco Use   Smoking status: Every Day    Current packs/day: 0.25    Average packs/day: 0.3 packs/day for 30.0 years (7.5 ttl pk-yrs)    Types: Cigarettes   Smokeless tobacco: Never  Substance and Sexual Activity   Alcohol use: Yes    Comment: a few per week.   Drug use: Yes    Comment: 2 drinks per week   Sexual activity: Yes  Other Topics Concern   Not on file  Social History Narrative   Not on file   Social Drivers of Health   Financial Resource Strain: Not on file  Food Insecurity: Not on file  Transportation Needs: Not on file  Physical Activity: Not on file  Stress: Not on file  Social Connections: Not on file     Family History: The patient's family history is negative for Cancer.  ROS:   Please see the history of present illness.     All other systems reviewed and are negative.  EKGs/Labs/Other Studies  Reviewed:    The following studies were reviewed today:   EKG:   01/05/2023: Normal sinus rhythm, rate 70, no ST abnormalities  Recent Labs: 01/05/2023: ALT 18; BUN 15; Creatinine, Ser 0.84; Hemoglobin 15.8; Platelets 261; Potassium 4.2; Sodium 140  Recent Lipid Panel No results found for: "CHOL", "TRIG", "HDL", "CHOLHDL", "VLDL", "LDLCALC", "LDLDIRECT"  Physical Exam:    VS:  BP 138/74 (BP Location: Right Arm, Cuff Size: Normal)   Pulse 78   Ht 5\' 10"  (1.778 m)   Wt 165 lb 6.4 oz (75 kg)   BMI 23.73 kg/m     Wt Readings from Last 3 Encounters:  05/18/23 165 lb 6.4 oz (75 kg)  01/05/23 164 lb (74.4 kg)  09/25/15 163 lb 6.4 oz (74.1 kg)     GEN:  Well nourished, well developed in no acute distress HEENT: Normal NECK: No JVD; No  carotid bruits LYMPHATICS: No lymphadenopathy CARDIAC: RRR, no murmurs, rubs, gallops RESPIRATORY:  Clear to auscultation without rales, wheezing or rhonchi  ABDOMEN: Soft, non-tender, non-distended MUSCULOSKELETAL:  No edema; No deformity  SKIN: Warm and dry NEUROLOGIC:  Alert and oriented x 3 PSYCHIATRIC:  Normal affect   ASSESSMENT:    1. PAF (paroxysmal atrial fibrillation) (HCC)   2. Hyperlipidemia, unspecified hyperlipidemia type   3. Essential hypertension   4. Tobacco use     PLAN:    Paroxysmal atrial fibrillation: Noted to be in A-fib at appointment with Dr. Docia Chuck on 08/2022.  Rate controlled despite no AV nodal blockers.  CHA2DS2-VASc 2 (hypertension, age). Zio patch x 13 days 12/2022 showed 6% A-fib burden with average rate 104 bpm and longest episode lasting 8.5 hours, 1 episode of NSVT lasting 5 beats.  Echocardiogram 02/02/2023 showed normal biventricular function, no significant valvular disease. -Continue Xarelto 20 mg daily.  Had issues with remembering to take Eliquis twice daily -Continue Toprol-XL 25 mg daily  Hypertension: On amlodipine-benazepril 10-20 mg daily and Toprol-XL 25 mg daily.  Appears controlled  Hyperlipidemia: On pravastatin 20 mg daily.  LDL 82 on 09/15/2022.  Check calcium score to guide how aggressive to be in lowering cholesterol  Tobacco use: Counseled on risk of smoking and cessation strongly recommended.  He is not interested in quitting at this time.  RTC in 6 months   Medication Adjustments/Labs and Tests Ordered: Current medicines are reviewed at length with the patient today.  Concerns regarding medicines are outlined above.  Orders Placed This Encounter  Procedures   CT CARDIAC SCORING (SELF PAY ONLY)   No orders of the defined types were placed in this encounter.   Patient Instructions  Medication Instructions:  Continue current medications *If you need a refill on your cardiac medications before your next appointment,  please call your pharmacy*   Lab Work: none If you have labs (blood work) drawn today and your tests are completely normal, you will receive your results only by: MyChart Message (if you have MyChart) OR A paper copy in the mail If you have any lab test that is abnormal or we need to change your treatment, we will call you to review the results.   Testing/Procedures: Calcium score Dr. Bjorn Pippin  has ordered a CT coronary calcium score.   Test locations:  [facility]  [facility]  This is $99 out of pocket.   Coronary CalciumScan A coronary calcium scan is an imaging test used to look for deposits of calcium and  other fatty materials (plaques) in the inner lining of the blood vessels of the heart (coronary arteries). These deposits of calcium and plaques can partly clog and narrow the coronary arteries without producing any symptoms or warning signs. This puts a person at risk for a heart attack. This test can detect these deposits before symptoms develop. Tell a health care provider about: Any allergies you have. All medicines you are taking, including vitamins, herbs, eye drops, creams, and over-the-counter medicines. Any problems you or family members have had with anesthetic medicines. Any blood disorders you have. Any surgeries you have had. Any medical conditions you have. Whether you are pregnant or may be pregnant. What are the risks? Generally, this is a safe procedure. However, problems may occur, including: Harm to a pregnant woman and her unborn baby. This test involves the use of radiation. Radiation exposure can be dangerous to a pregnant woman and her unborn baby. If you are pregnant, you generally should not have this procedure done. Slight increase in the risk of cancer. This is because of the radiation involved in the test. What happens before the procedure? No preparation  is needed for this procedure. What happens during the procedure? You will undress and remove any jewelry around your neck or chest. You will put on a hospital gown. Sticky electrodes will be placed on your chest. The electrodes will be connected to an electrocardiogram (ECG) machine to record a tracing of the electrical activity of your heart. A CT scanner will take pictures of your heart. During this time, you will be asked to lie still and hold your breath for 2-3 seconds while a picture of your heart is being taken. The procedure may vary among health care providers and hospitals. What happens after the procedure? You can get dressed. You can return to your normal activities. It is up to you to get the results of your test. Ask your health care provider, or the department that is doing the test, when your results will be ready. Summary A coronary calcium scan is an imaging test used to look for deposits of calcium and other fatty materials (plaques) in the inner lining of the blood vessels of the heart (coronary arteries). Generally, this is a safe procedure. Tell your health care provider if you are pregnant or may be pregnant. No preparation is needed for this procedure. A CT scanner will take pictures of your heart. You can return to your normal activities after the scan is done. This information is not intended to replace advice given to you by your health care provider. Make sure you discuss any questions you have with your health care provider. Document Released: 09/12/2007 Document Revised: 02/03/2016 Document Reviewed: 02/03/2016 Elsevier Interactive Patient Education  2017 [employer].   Follow-Up: At [facility], you and your health needs are our priority.  As part of our continuing mission to provide you with exceptional heart care, we have created designated Provider Care Teams.  These Care Teams include your primary Cardiologist (physician) and Advanced Practice  Providers (APPs -  Physician Assistants and Nurse Practitioners) who all work together to provide you with the care you need, when you need it.  We recommend signing up for the patient portal called "MyChart".  Sign up information is provided on this After Visit Summary.  MyChart is used to connect with patients for Virtual Visits (Telemedicine).  Patients are able to view lab/test results, encounter notes, upcoming appointments, etc.  Non-urgent messages can be sent  to your provider as well.   To learn more about what you can do with MyChart, go to [email or web address].    Your next appointment:   6 month(s)  Provider:   Dr. Bjorn Pippin  Other Instructions none       Signed, Little Ishikawa, MD  05/18/2023 8:44 AM    [facility]

## 2023-05-18 ENCOUNTER — Ambulatory Visit: Payer: Medicare PPO | Attending: Cardiology | Admitting: Cardiology

## 2023-05-18 VITALS — BP 138/74 | HR 78 | Ht 70.0 in | Wt 165.4 lb

## 2023-05-18 DIAGNOSIS — E785 Hyperlipidemia, unspecified: Secondary | ICD-10-CM

## 2023-05-18 DIAGNOSIS — I480 Paroxysmal atrial fibrillation: Secondary | ICD-10-CM | POA: Diagnosis not present

## 2023-05-18 DIAGNOSIS — Z720 Tobacco use: Secondary | ICD-10-CM

## 2023-05-18 DIAGNOSIS — I10 Essential (primary) hypertension: Secondary | ICD-10-CM | POA: Diagnosis not present

## 2023-05-18 NOTE — Patient Instructions (Addendum)
Medication Instructions:  Continue current medications *If you need a refill on your cardiac medications before your next appointment, please call your pharmacy*   Lab Work: none If you have labs (blood work) drawn today and your tests are completely normal, you will receive your results only by: MyChart Message (if you have MyChart) OR A paper copy in the mail If you have any lab test that is abnormal or we need to change your treatment, we will call you to review the results.   Testing/Procedures: Calcium score Dr. Bjorn Pippin  has ordered a CT coronary calcium score.   Test locations:  [facility]  [facility] at [facility]  This is $99 out of pocket.   Coronary CalciumScan A coronary calcium scan is an imaging test used to look for deposits of calcium and other fatty materials (plaques) in the inner lining of the blood vessels of the heart (coronary arteries). These deposits of calcium and plaques can partly clog and narrow the coronary arteries without producing any symptoms or warning signs. This puts a person at risk for a heart attack. This test can detect these deposits before symptoms develop. Tell a health care provider about: Any allergies you have. All medicines you are taking, including vitamins, herbs, eye drops, creams, and over-the-counter medicines. Any problems you or family members have had with anesthetic medicines. Any blood disorders you have. Any surgeries you have had. Any medical conditions you have. Whether you are pregnant or may be pregnant. What are the risks? Generally, this is a safe procedure. However, problems may occur, including: Harm to a pregnant woman and her unborn baby. This test involves the use of radiation. Radiation exposure can be dangerous to a pregnant woman and her unborn baby. If you are pregnant, you generally should not have this procedure done. Slight  increase in the risk of cancer. This is because of the radiation involved in the test. What happens before the procedure? No preparation is needed for this procedure. What happens during the procedure? You will undress and remove any jewelry around your neck or chest. You will put on a hospital gown. Sticky electrodes will be placed on your chest. The electrodes will be connected to an electrocardiogram (ECG) machine to record a tracing of the electrical activity of your heart. A CT scanner will take pictures of your heart. During this time, you will be asked to lie still and hold your breath for 2-3 seconds while a picture of your heart is being taken. The procedure may vary among health care providers and hospitals. What happens after the procedure? You can get dressed. You can return to your normal activities. It is up to you to get the results of your test. Ask your health care provider, or the department that is doing the test, when your results will be ready. Summary A coronary calcium scan is an imaging test used to look for deposits of calcium and other fatty materials (plaques) in the inner lining of the blood vessels of the heart (coronary arteries). Generally, this is a safe procedure. Tell your health care provider if you are pregnant or may be pregnant. No preparation is needed for this procedure. A CT scanner will take pictures of your heart. You can return to your normal activities after the scan is done. This information is not intended to replace advice given to you by your health care provider. Make sure you discuss any questions you  have with your health care provider. Document Released: 09/12/2007 Document Revised: 02/03/2016 Document Reviewed: 02/03/2016 Elsevier Interactive Patient Education  2017 [employer].   Follow-Up: At [facility], you and your health needs are our priority.  As part of our continuing mission to provide you with exceptional heart care,  we have created designated Provider Care Teams.  These Care Teams include your primary Cardiologist (physician) and Advanced Practice Providers (APPs -  Physician Assistants and Nurse Practitioners) who all work together to provide you with the care you need, when you need it.  We recommend signing up for the patient portal called "MyChart".  Sign up information is provided on this After Visit Summary.  MyChart is used to connect with patients for Virtual Visits (Telemedicine).  Patients are able to view lab/test results, encounter notes, upcoming appointments, etc.  Non-urgent messages can be sent to your provider as well.   To learn more about what you can do with MyChart, go to [email or web address].    Your next appointment:   6 month(s)  Provider:   Dr. Bjorn Pippin  Other Instructions none

## 2023-05-27 ENCOUNTER — Ambulatory Visit (HOSPITAL_COMMUNITY)
Admission: RE | Admit: 2023-05-27 | Discharge: 2023-05-27 | Disposition: A | Discharge status: Home / Self Care | Payer: Self-pay | Source: Ambulatory Visit | Attending: Cardiology | Admitting: Cardiology

## 2023-05-27 DIAGNOSIS — E785 Hyperlipidemia, unspecified: Secondary | ICD-10-CM

## 2023-09-30 DIAGNOSIS — Z1331 Encounter for screening for depression: Secondary | ICD-10-CM | POA: Diagnosis not present

## 2023-09-30 DIAGNOSIS — I480 Paroxysmal atrial fibrillation: Secondary | ICD-10-CM | POA: Diagnosis not present

## 2023-09-30 DIAGNOSIS — I10 Essential (primary) hypertension: Secondary | ICD-10-CM | POA: Diagnosis not present

## 2023-09-30 DIAGNOSIS — E7800 Pure hypercholesterolemia, unspecified: Secondary | ICD-10-CM | POA: Diagnosis not present

## 2023-09-30 DIAGNOSIS — R7303 Prediabetes: Secondary | ICD-10-CM | POA: Diagnosis not present

## 2023-09-30 DIAGNOSIS — L03012 Cellulitis of left finger: Secondary | ICD-10-CM | POA: Diagnosis not present

## 2023-09-30 DIAGNOSIS — Z0001 Encounter for general adult medical examination with abnormal findings: Secondary | ICD-10-CM | POA: Diagnosis not present

## 2023-09-30 DIAGNOSIS — I700 Atherosclerosis of aorta: Secondary | ICD-10-CM | POA: Diagnosis not present

## 2023-09-30 DIAGNOSIS — Z79899 Other long term (current) drug therapy: Secondary | ICD-10-CM | POA: Diagnosis not present

## 2023-11-23 ENCOUNTER — Encounter: Payer: Self-pay | Admitting: Emergency Medicine

## 2023-11-23 ENCOUNTER — Ambulatory Visit: Attending: Emergency Medicine | Admitting: Emergency Medicine

## 2023-11-23 VITALS — BP 120/76 | HR 58 | Ht 70.0 in | Wt 162.0 lb

## 2023-11-23 DIAGNOSIS — I10 Essential (primary) hypertension: Secondary | ICD-10-CM

## 2023-11-23 NOTE — Patient Instructions (Addendum)
 Medication Instructions:  Your physician recommends that you continue on your current medications as directed. Please refer to the Current Medication list given to you today.    Lab Work: None ordered   Testing/Procedures: None ordered  Follow-Up: At [facility], you and your health needs are our priority.  As part of our continuing mission to provide you with exceptional heart care, our providers are all part of one team.  This team includes your primary Cardiologist (physician) and Advanced Practice Providers or APPs (Physician Assistants and Nurse Practitioners) who all work together to provide you with the care you need, when you need it.  Your next appointment:    6 MONTHS   Provider:   Lonni LITTIE Nanas, MD   OR [place], NP  We recommend signing up for the patient portal called MyChart.  Sign up information is provided on this After Visit Summary.  MyChart is used to connect with patients for Virtual Visits (Telemedicine).  Patients are able to view lab/test results, encounter notes, upcoming appointments, etc.  Non-urgent messages can be sent to your provider as well.   To learn more about what you can do with MyChart, go to [email or web address].   Other Instructions

## 2023-11-23 NOTE — Progress Notes (Signed)
 Cardiology Office Note:    Date:  11/23/2023  ID:  Dylan Griffith, DOB 1950-11-29, MRN [id number] PCP: Dylan Slater, MD  [facility] Providers Cardiologist:  Dylan Griffith Nanas, MD Cardiology APP:  Dylan Lum LITTIE, NP       Patient Profile:       Chief Complaint: 28-month follow-up History of Present Illness:  Dylan Griffith is a 73 y.o. male with visit-pertinent history of paroxysmal atrial fibrillation, prostate cancer, hypertension  He was referred to cardiology service for evaluation of atrial fibrillation.  He was noted to be in A-fib had appointment with his PCP on 08/2022.  Zio patch x 13 days on 12/2022 showed 6% atrial fibrillation burden with average rate of 104 bpm and longest episode lasting 8.5 hours, as well as 1 episode of NSVT lasting 5 beats.  Echocardiogram 02/02/2023 showed normal biventricular function and no significant valvular disease.  He was last seen in office on 05/18/2023.  He was maintaining NSR.  Dr. Medication management was continued.  He did undergo CT cardiac scoring on 05/27/2023 showing coronary calcium score of 0 and aortic atherosclerosis.   Discussed the use of AI scribe software for clinical note transcription with the patient, who gave verbal consent to proceed.  History of Present Illness Dylan Griffith is a 73 year old male with atrial fibrillation who presents for a routine follow-up.  Diagnosed with atrial fibrillation in June of the previous year, he is currently on metoprolol  for rhythm control and Xarelto  for stroke prevention.  He denies any symptoms concerning for recurrent atrial fibrillation.  He expresses interest in potentially discontinuing metoprolol . He experiences no lightheadedness, dizziness, chest pain, dyspnea, orthopnea, palpitations, or leg swelling.  He adheres to his medication regimen, taking all medications, including vitamins, once daily in the morning. He maintains cardiovascular fitness by walking for 30  minutes three times a week at the [facility] and cutting his yard weekly.   Review of systems:  Please see the history of present illness. All other systems are reviewed and otherwise negative.      Studies Reviewed:    EKG Interpretation Date/Time:  Tuesday November 23 2023 08:30:26 EDT Ventricular Rate:  58 PR Interval:  192 QRS Duration:  78 QT Interval:  430 QTC Calculation: 422 R Axis:   46  Text Interpretation: Sinus bradycardia When compared with ECG of 05-Jan-2023 08:25, No significant change was found Confirmed by Dylan Lum [phone]) on 11/23/2023 8:32:49 AM    CT cardiac scoring 05/27/2023 1. Coronary calcium score of 0. 2. Aortic atherosclerosis.  Echocardiogram 02/02/2023 1. Left ventricular ejection fraction, by estimation, is 55 to 60%. The  left ventricle has normal function. The left ventricle has no regional  wall motion abnormalities. Left ventricular diastolic parameters are  indeterminate. The average left  ventricular global longitudinal strain is 17.6 %. The global longitudinal  strain is normal.   2. Right ventricular systolic function is normal. The right ventricular  size is normal.   3. The mitral valve is normal in structure. Mild mitral valve  regurgitation.   4. The aortic valve is tricuspid. Aortic valve regurgitation is not  visualized.   5. The inferior vena cava is normal in size with greater than 50%  respiratory variability, suggesting right atrial pressure of 3 mmHg.   ZIO 02/01/2023   1 episode of NSVT lasting 5 beats   6% A-fib burden, average rate 104 bpm with longest episode lasting 8.5 hours with average rate 87 bpm  Patch Wear Time:  13 days and 1 hours (2024-10-11T10:36:27-0400 to 2024-10-24T11:37:38-0400)   Patient had a min HR of 29 bpm, max HR of 266 bpm, and avg HR of 76 bpm. Predominant underlying rhythm was Sinus Rhythm. 1 run of Ventricular Tachycardia occurred lasting 5 beats with a max rate of 266 bpm (avg 245 bpm).  Atrial Fibrillation occurred (6%  burden), ranging from 45-212 bpm (avg of 104 bpm), the longest lasting 8 hours 31 mins with an avg rate of 87 bpm. Second Degree AV Block-Mobitz I (Wenckebach) was present. Isolated SVEs were rare (<1.0%), SVE Couplets were rare (<1.0%), and SVE Triplets  were rare (<1.0%). Isolated VEs were rare (<1.0%, 11791), VE Couplets were rare (<1.0%, 50), and VE Triplets were rare (<1.0%, 4). Ventricular Bigeminy and Trigeminy were present.  Risk Assessment/Calculations:    CHA2DS2-VASc Score = 3   This indicates a 3.2% annual risk of stroke. The patient's score is based upon: CHF History: 0 HTN History: 1 Diabetes History: 0 Stroke History: 0 Vascular Disease History: 0 Age Score: 2 Gender Score: 0             Physical Exam:   VS:  BP 120/76 (BP Location: Right Arm, Patient Position: Sitting, Cuff Size: Normal)   Pulse (!) 58   Ht 5' 10 (1.778 m)   Wt 162 lb (73.5 kg)   SpO2 99%   BMI 23.24 kg/m    Wt Readings from Last 3 Encounters:  11/23/23 162 lb (73.5 kg)  05/18/23 165 lb 6.4 oz (75 kg)  01/05/23 164 lb (74.4 kg)    GEN: Well nourished, well developed in no acute distress NECK: No JVD; No carotid bruits CARDIAC: RRR, no murmurs, rubs, gallops RESPIRATORY:  Clear to auscultation without rales, wheezing or rhonchi  ABDOMEN: Soft, non-tender, non-distended EXTREMITIES:  No edema; No acute deformity      Assessment and Plan:  Paroxysmal atrial fibrillation Originally diagnosed on 08/2022 by PCP ZIO 01/2023 with 6% A-fib burden, average rate 104 bpm with longest episode lasting 8.5 hours Echocardiogram 01/2023 with normal biventricular function, normal LA size, and no significant valvular disease EKG today shows he is maintaining sinus rhythm - Continue Xarelto  20 mg daily - Continue metoprolol  XL 25 mg daily  Hypertension Blood pressure today is 120/76 and well-controlled - Continue amlodipine-benazepril 10-20 mg  daily  Hyperlipidemia CT cardiac scoring 05/2023 with CAC of 0 and aortic atherosclerosis Has been managed by PCP - Continue pravastatin 20 mg daily  Tobacco use - Encouraged complete cessation      Dispo:  Return in about 6 months (around 05/25/2024).  Signed, Lum Griffith Louis, NP

## 2023-12-14 DIAGNOSIS — L03012 Cellulitis of left finger: Secondary | ICD-10-CM | POA: Diagnosis not present

## 2023-12-23 ENCOUNTER — Other Ambulatory Visit: Payer: Self-pay | Admitting: Cardiology

## 2024-01-15 ENCOUNTER — Other Ambulatory Visit: Payer: Self-pay | Admitting: Cardiology

## 2024-02-29 DIAGNOSIS — C61 Malignant neoplasm of prostate: Secondary | ICD-10-CM | POA: Diagnosis not present

## 2024-02-29 DIAGNOSIS — N5201 Erectile dysfunction due to arterial insufficiency: Secondary | ICD-10-CM | POA: Diagnosis not present
